# Patient Record
Sex: Female | Born: 1975 | Race: White | Hispanic: No | Marital: Single | Smoking: Former smoker
Health system: Southern US, Community
[De-identification: ages and names within clinical notes are randomized; demographics above are authoritative.]

## PROBLEM LIST (undated history)

## (undated) DIAGNOSIS — F329 Major depressive disorder, single episode, unspecified: Secondary | ICD-10-CM

## (undated) DIAGNOSIS — F32A Depression, unspecified: Secondary | ICD-10-CM

## (undated) DIAGNOSIS — F419 Anxiety disorder, unspecified: Secondary | ICD-10-CM

## (undated) DIAGNOSIS — I10 Essential (primary) hypertension: Secondary | ICD-10-CM

## (undated) DIAGNOSIS — IMO0001 Reserved for inherently not codable concepts without codable children: Secondary | ICD-10-CM

## (undated) DIAGNOSIS — G4730 Sleep apnea, unspecified: Secondary | ICD-10-CM

## (undated) DIAGNOSIS — J302 Other seasonal allergic rhinitis: Secondary | ICD-10-CM

## (undated) DIAGNOSIS — F17200 Nicotine dependence, unspecified, uncomplicated: Secondary | ICD-10-CM

## (undated) HISTORY — DX: Other seasonal allergic rhinitis: J30.2

## (undated) HISTORY — PX: RHINOPLASTY: SUR1284

---
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
## 1999-04-20 ENCOUNTER — Other Ambulatory Visit: Admission: RE | Admit: 1999-04-20 | Discharge: 1999-04-20 | Payer: Self-pay | Admitting: *Deleted

## 2000-03-29 ENCOUNTER — Ambulatory Visit (HOSPITAL_COMMUNITY): Admission: RE | Admit: 2000-03-29 | Discharge: 2000-03-29 | Payer: Self-pay | Admitting: Family Medicine

## 2000-03-29 ENCOUNTER — Encounter: Payer: Self-pay | Admitting: Family Medicine

## 2000-07-28 ENCOUNTER — Other Ambulatory Visit: Admission: RE | Admit: 2000-07-28 | Discharge: 2000-07-28 | Payer: Self-pay | Admitting: *Deleted

## 2000-11-28 ENCOUNTER — Emergency Department (HOSPITAL_COMMUNITY): Admission: EM | Admit: 2000-11-28 | Discharge: 2000-11-28 | Payer: Self-pay | Admitting: Emergency Medicine

## 2000-11-28 ENCOUNTER — Encounter: Payer: Self-pay | Admitting: Emergency Medicine

## 2001-02-22 ENCOUNTER — Other Ambulatory Visit: Admission: RE | Admit: 2001-02-22 | Discharge: 2001-02-22 | Payer: Self-pay | Admitting: Obstetrics & Gynecology

## 2001-10-01 ENCOUNTER — Inpatient Hospital Stay (HOSPITAL_COMMUNITY): Admission: AD | Admit: 2001-10-01 | Discharge: 2001-10-04 | Payer: Self-pay | Admitting: Obstetrics & Gynecology

## 2001-11-16 ENCOUNTER — Other Ambulatory Visit: Admission: RE | Admit: 2001-11-16 | Discharge: 2001-11-16 | Payer: Self-pay | Admitting: Obstetrics & Gynecology

## 2002-12-25 ENCOUNTER — Other Ambulatory Visit: Admission: RE | Admit: 2002-12-25 | Discharge: 2002-12-25 | Payer: Self-pay | Admitting: Obstetrics & Gynecology

## 2003-02-04 ENCOUNTER — Other Ambulatory Visit: Admission: RE | Admit: 2003-02-04 | Discharge: 2003-02-04 | Payer: Self-pay | Admitting: Obstetrics & Gynecology

## 2003-07-11 ENCOUNTER — Other Ambulatory Visit: Admission: RE | Admit: 2003-07-11 | Discharge: 2003-07-11 | Payer: Self-pay | Admitting: Obstetrics & Gynecology

## 2003-09-24 ENCOUNTER — Inpatient Hospital Stay (HOSPITAL_COMMUNITY): Admission: AD | Admit: 2003-09-24 | Discharge: 2003-09-24 | Payer: Self-pay | Admitting: Obstetrics & Gynecology

## 2003-09-30 ENCOUNTER — Inpatient Hospital Stay (HOSPITAL_COMMUNITY): Admission: AD | Admit: 2003-09-30 | Discharge: 2003-10-03 | Payer: Self-pay | Admitting: Obstetrics and Gynecology

## 2003-11-12 ENCOUNTER — Other Ambulatory Visit: Admission: RE | Admit: 2003-11-12 | Discharge: 2003-11-12 | Payer: Self-pay | Admitting: Obstetrics & Gynecology

## 2007-06-28 HISTORY — PX: KNEE ARTHROSCOPY WITH LATERAL RELEASE: SHX5649

## 2012-09-16 ENCOUNTER — Encounter (HOSPITAL_COMMUNITY)
Admission: EM | Disposition: A | Discharge status: Home / Self Care | Payer: Self-pay | Source: Home / Self Care | Attending: Emergency Medicine

## 2012-09-16 ENCOUNTER — Emergency Department (HOSPITAL_COMMUNITY): Payer: BC Managed Care – PPO | Admitting: Anesthesiology

## 2012-09-16 ENCOUNTER — Encounter (HOSPITAL_COMMUNITY): Payer: Self-pay | Admitting: Anesthesiology

## 2012-09-16 ENCOUNTER — Encounter (HOSPITAL_COMMUNITY): Payer: Self-pay | Admitting: *Deleted

## 2012-09-16 ENCOUNTER — Emergency Department (HOSPITAL_COMMUNITY): Payer: BC Managed Care – PPO

## 2012-09-16 ENCOUNTER — Observation Stay (HOSPITAL_COMMUNITY)
Admission: EM | Admit: 2012-09-16 | Discharge: 2012-09-18 | Disposition: A | Discharge status: Home / Self Care | Payer: BC Managed Care – PPO | Attending: General Surgery | Admitting: General Surgery

## 2012-09-16 DIAGNOSIS — R079 Chest pain, unspecified: Secondary | ICD-10-CM | POA: Insufficient documentation

## 2012-09-16 DIAGNOSIS — Z01810 Encounter for preprocedural cardiovascular examination: Secondary | ICD-10-CM | POA: Insufficient documentation

## 2012-09-16 DIAGNOSIS — K810 Acute cholecystitis: Secondary | ICD-10-CM

## 2012-09-16 DIAGNOSIS — K8010 Calculus of gallbladder with chronic cholecystitis without obstruction: Principal | ICD-10-CM | POA: Insufficient documentation

## 2012-09-16 DIAGNOSIS — K429 Umbilical hernia without obstruction or gangrene: Secondary | ICD-10-CM | POA: Insufficient documentation

## 2012-09-16 HISTORY — DX: Nicotine dependence, unspecified, uncomplicated: F17.200

## 2012-09-16 HISTORY — DX: Reserved for inherently not codable concepts without codable children: IMO0001

## 2012-09-16 HISTORY — PX: CHOLECYSTECTOMY: SHX55

## 2012-09-16 LAB — HEPATIC FUNCTION PANEL
AST: 25 U/L (ref 0–37)
Albumin: 4.2 g/dL (ref 3.5–5.2)
Total Bilirubin: 0.7 mg/dL (ref 0.3–1.2)
Total Protein: 7.6 g/dL (ref 6.0–8.3)

## 2012-09-16 LAB — POCT I-STAT TROPONIN I

## 2012-09-16 LAB — URINALYSIS, MICROSCOPIC ONLY
Glucose, UA: NEGATIVE mg/dL
Ketones, ur: NEGATIVE mg/dL
Leukocytes, UA: NEGATIVE
Nitrite: NEGATIVE
Protein, ur: NEGATIVE mg/dL
Urobilinogen, UA: 0.2 mg/dL (ref 0.0–1.0)

## 2012-09-16 LAB — BASIC METABOLIC PANEL
CO2: 25 mEq/L (ref 19–32)
Calcium: 10.1 mg/dL (ref 8.4–10.5)
Creatinine, Ser: 0.64 mg/dL (ref 0.50–1.10)
GFR calc Af Amer: >90 mL/min (ref >90)
GFR calc non Af Amer: >90 mL/min (ref >90)
Sodium: 138 mEq/L (ref 135–145)

## 2012-09-16 LAB — PREGNANCY, URINE: Preg Test, Ur: NEGATIVE

## 2012-09-16 LAB — CBC
MCH: 31.6 pg (ref 26.0–34.0)
Platelets: 457 10*3/uL — ABNORMAL HIGH (ref 150–400)
RBC: 4.71 MIL/uL (ref 3.87–5.11)
RDW: 12.7 % (ref 11.5–15.5)

## 2012-09-16 LAB — POCT PREGNANCY, URINE: Preg Test, Ur: NEGATIVE

## 2012-09-16 SURGERY — LAPAROSCOPIC CHOLECYSTECTOMY WITH INTRAOPERATIVE CHOLANGIOGRAM
Anesthesia: General | Wound class: Contaminated

## 2012-09-16 MED ORDER — SODIUM CHLORIDE 0.9 % IV SOLN: Freq: Once | INTRAVENOUS | Status: DC

## 2012-09-16 MED ORDER — LACTATED RINGERS IV SOLN
INTRAVENOUS | Status: DC | PRN
  Administered 2012-09-16: 1000 mL via INTRAVENOUS

## 2012-09-16 MED ORDER — ACETAMINOPHEN 10 MG/ML IV SOLN
INTRAVENOUS | Status: AC
  Filled 2012-09-16: qty 100

## 2012-09-16 MED ORDER — PIPERACILLIN-TAZOBACTAM 3.375 G IVPB 30 MIN
3.3750 g | Freq: Once | INTRAVENOUS | Status: AC
  Administered 2012-09-16: 3.375 g via INTRAVENOUS
  Filled 2012-09-16: qty 50

## 2012-09-16 MED ORDER — BUPIVACAINE-EPINEPHRINE 0.25% -1:200000 IJ SOLN
INTRAMUSCULAR | Status: AC
  Filled 2012-09-16: qty 1

## 2012-09-16 MED ORDER — NEOSTIGMINE METHYLSULFATE 1 MG/ML IJ SOLN
INTRAMUSCULAR | Status: DC | PRN
  Administered 2012-09-16: 4 mg via INTRAVENOUS

## 2012-09-16 MED ORDER — GLYCOPYRROLATE 0.2 MG/ML IJ SOLN
INTRAMUSCULAR | Status: DC | PRN
  Administered 2012-09-16: 0.6 mg via INTRAVENOUS

## 2012-09-16 MED ORDER — EPHEDRINE SULFATE 50 MG/ML IJ SOLN
INTRAMUSCULAR | Status: DC | PRN
  Administered 2012-09-16 (×2): 5 mg via INTRAVENOUS

## 2012-09-16 MED ORDER — PROMETHAZINE HCL 25 MG/ML IJ SOLN: 6.2500 mg | INTRAMUSCULAR | Status: DC | PRN

## 2012-09-16 MED ORDER — DEXTROSE IN LACTATED RINGERS 5 % IV SOLN
INTRAVENOUS | Status: DC
  Administered 2012-09-17 – 2012-09-18 (×3): via INTRAVENOUS

## 2012-09-16 MED ORDER — SUCCINYLCHOLINE CHLORIDE 20 MG/ML IJ SOLN
INTRAMUSCULAR | Status: DC | PRN
  Administered 2012-09-16: 100 mg via INTRAVENOUS

## 2012-09-16 MED ORDER — MORPHINE SULFATE 4 MG/ML IJ SOLN
4.0000 mg | Freq: Once | INTRAMUSCULAR | Status: AC
  Administered 2012-09-16: 4 mg via INTRAVENOUS
  Filled 2012-09-16: qty 1

## 2012-09-16 MED ORDER — HYDROMORPHONE HCL PF 1 MG/ML IJ SOLN
INTRAMUSCULAR | Status: AC
  Filled 2012-09-16: qty 1

## 2012-09-16 MED ORDER — BUPIVACAINE-EPINEPHRINE 0.25% -1:200000 IJ SOLN
INTRAMUSCULAR | Status: DC | PRN
  Administered 2012-09-16: 50 mL

## 2012-09-16 MED ORDER — ONDANSETRON HCL 4 MG/2ML IJ SOLN
4.0000 mg | Freq: Once | INTRAMUSCULAR | Status: AC
  Administered 2012-09-16: 4 mg via INTRAVENOUS
  Filled 2012-09-16: qty 2

## 2012-09-16 MED ORDER — DEXAMETHASONE SODIUM PHOSPHATE 10 MG/ML IJ SOLN
INTRAMUSCULAR | Status: DC | PRN
  Administered 2012-09-16: 10 mg via INTRAVENOUS

## 2012-09-16 MED ORDER — LIDOCAINE HCL (CARDIAC) 20 MG/ML IV SOLN
INTRAVENOUS | Status: DC | PRN
  Administered 2012-09-16: 50 mg via INTRAVENOUS

## 2012-09-16 MED ORDER — FENTANYL CITRATE 0.05 MG/ML IJ SOLN
INTRAMUSCULAR | Status: DC | PRN
  Administered 2012-09-16 (×2): 100 ug via INTRAVENOUS
  Administered 2012-09-16: 50 ug via INTRAVENOUS

## 2012-09-16 MED ORDER — ROCURONIUM BROMIDE 100 MG/10ML IV SOLN
INTRAVENOUS | Status: DC | PRN
  Administered 2012-09-16: 30 mg via INTRAVENOUS

## 2012-09-16 MED ORDER — ACETAMINOPHEN 10 MG/ML IV SOLN
INTRAVENOUS | Status: DC | PRN
  Administered 2012-09-16: 1000 mg via INTRAVENOUS

## 2012-09-16 MED ORDER — SODIUM CHLORIDE 0.9 % IV BOLUS (SEPSIS)
1000.0000 mL | Freq: Once | INTRAVENOUS | Status: AC
  Administered 2012-09-16: 1000 mL via INTRAVENOUS

## 2012-09-16 MED ORDER — IOHEXOL 300 MG/ML  SOLN
INTRAMUSCULAR | Status: AC
  Filled 2012-09-16: qty 1

## 2012-09-16 MED ORDER — ONDANSETRON HCL 4 MG/2ML IJ SOLN
INTRAMUSCULAR | Status: DC | PRN
  Administered 2012-09-16: 4 mg via INTRAVENOUS

## 2012-09-16 MED ORDER — LACTATED RINGERS IV SOLN
INTRAVENOUS | Status: DC | PRN
  Administered 2012-09-16 (×2): via INTRAVENOUS

## 2012-09-16 MED ORDER — IOHEXOL 300 MG/ML  SOLN
INTRAMUSCULAR | Status: DC | PRN
  Administered 2012-09-16: 50 mL

## 2012-09-16 MED ORDER — HYDROMORPHONE HCL PF 1 MG/ML IJ SOLN
0.2500 mg | INTRAMUSCULAR | Status: DC | PRN
  Administered 2012-09-16 (×4): 0.5 mg via INTRAVENOUS

## 2012-09-16 MED ORDER — PROPOFOL 10 MG/ML IV BOLUS
INTRAVENOUS | Status: DC | PRN
  Administered 2012-09-16: 150 mg via INTRAVENOUS

## 2012-09-16 SURGICAL SUPPLY — 42 items
APPLIER CLIP ROT 10 11.4 M/L (STAPLE) ×2
BENZOIN TINCTURE PRP APPL 2/3 (GAUZE/BANDAGES/DRESSINGS) IMPLANT
CANISTER SUCTION 2500CC (MISCELLANEOUS) ×2 IMPLANT
CATH REDDICK CHOLANGI 4FR 50CM (CATHETERS) IMPLANT
CHLORAPREP W/TINT 26ML (MISCELLANEOUS) ×2 IMPLANT
CLIP APPLIE ROT 10 11.4 M/L (STAPLE) ×1 IMPLANT
CLOTH BEACON ORANGE TIMEOUT ST (SAFETY) ×2 IMPLANT
COVER MAYO STAND STRL (DRAPES) ×2 IMPLANT
DECANTER SPIKE VIAL GLASS SM (MISCELLANEOUS) ×2 IMPLANT
DERMABOND ADVANCED (GAUZE/BANDAGES/DRESSINGS) ×1
DERMABOND ADVANCED .7 DNX12 (GAUZE/BANDAGES/DRESSINGS) ×1 IMPLANT
DRAPE C-ARM 42X72 X-RAY (DRAPES) ×2 IMPLANT
DRAPE LAPAROSCOPIC ABDOMINAL (DRAPES) ×2 IMPLANT
ELECT REM PT RETURN 9FT ADLT (ELECTROSURGICAL) ×2
ELECTRODE REM PT RTRN 9FT ADLT (ELECTROSURGICAL) ×1 IMPLANT
GLOVE BIOGEL PI IND STRL 7.0 (GLOVE) ×1 IMPLANT
GLOVE BIOGEL PI INDICATOR 7.0 (GLOVE) ×1
GLOVE SS BIOGEL STRL SZ 7.5 (GLOVE) ×1 IMPLANT
GLOVE SUPERSENSE BIOGEL SZ 7.5 (GLOVE) ×1
GOWN STRL NON-REIN LRG LVL3 (GOWN DISPOSABLE) ×2 IMPLANT
GOWN STRL REIN XL XLG (GOWN DISPOSABLE) ×4 IMPLANT
HEMOSTAT SURGICEL 4X8 (HEMOSTASIS) IMPLANT
IV CATH 14GX2 1/4 (CATHETERS) IMPLANT
IV SET EXT 30 76VOL 4 MALE LL (IV SETS) IMPLANT
KIT BASIN OR (CUSTOM PROCEDURE TRAY) ×2 IMPLANT
NS IRRIG 1000ML POUR BTL (IV SOLUTION) IMPLANT
POUCH SPECIMEN RETRIEVAL 10MM (ENDOMECHANICALS) IMPLANT
SET CHOLANGIOGRAPH MIX (MISCELLANEOUS) ×2 IMPLANT
SET IRRIG TUBING LAPAROSCOPIC (IRRIGATION / IRRIGATOR) ×2 IMPLANT
SOLUTION ANTI FOG 6CC (MISCELLANEOUS) ×2 IMPLANT
STOPCOCK K 69 2C6206 (IV SETS) IMPLANT
STRIP CLOSURE SKIN 1/2X4 (GAUZE/BANDAGES/DRESSINGS) IMPLANT
SUT MNCRL AB 4-0 PS2 18 (SUTURE) ×2 IMPLANT
SUT VICRYL 0 UR6 27IN ABS (SUTURE) ×2 IMPLANT
TOWEL OR 17X26 10 PK STRL BLUE (TOWEL DISPOSABLE) ×2 IMPLANT
TRAY LAP CHOLE (CUSTOM PROCEDURE TRAY) ×2 IMPLANT
TROCAR SLEEVE XCEL 5X75 (ENDOMECHANICALS) ×2 IMPLANT
TROCAR XCEL BLADELESS 5X75MML (TROCAR) ×2 IMPLANT
TROCAR XCEL BLUNT TIP 100MML (ENDOMECHANICALS) ×2 IMPLANT
TROCAR XCEL NON-BLD 11X100MML (ENDOMECHANICALS) ×2 IMPLANT
TROCAR Z-THREAD FIOS 5X100MM (TROCAR) ×2 IMPLANT
TUBING INSUFFLATION 10FT LAP (TUBING) ×2 IMPLANT

## 2012-09-16 NOTE — Op Note (Signed)
Preoperative diagnosis: Cholelithiasis and cholecystitis  Postoperative diagnosis: Cholelithiasis and cholecystitis  Surgical procedure: Laparoscopic cholecystectomy   Surgeon: Sharlet Salina Lyons. Annalea Alguire M.D.  Assistant: Harriette Bouillon  Anesthesia: General Endotracheal  Complications: None  Estimated blood loss: Minimal  Description of procedure: The patient brought to the operating room, placed in the supine position on the operating table, and general endotracheal anesthesia induced. The abdomen was widely sterilely prepped and draped. The patient had received preoperative IV antibiotics and PAS were in place. Patient timeout was performed the correct procedure verified. Standard 4 port technique was used with an open Hassan cannula at the umbilicus and the remainder of the ports placed under direct vision. The gallbladder was visualized. It appeared markedly edematous. The fundus was grasped and elevated up over the liver and the infundibulum retracted inferiolaterally. Peritoneum anterior and posterior to close triangle was incised and fibrofatty tissue stripped off the neck of the gallbladder toward the porta hepatis. The distal gallbladder was thoroughly dissected. The cystic artery was identified in close triangle and the cystic duct gallbladder junction dissected 360.  A good critical view was obtained. When the anatomy was clear the cystic duct was clipped at the gallbladder junction and an operative cholangiogram attempted but the cystic duct was so tiny it would not admit the cholangiocath. The cystic duct was doubly clipped proximally and divided. The cystic artery was doubly clipped proximally and distally and divided. The gallbladder was dissected free from its bed using hook cautery and removed through the umbilical port site. Complete hemostasis was obtained in the gallbladder bed. The right upper quadrant was thoroughly irrigated and hemostasis assured. Trochars were removed and all CO2  evacuated and the [facility] trocar site fascial defect closed. A very small umbilical hernia was defined and included in the closure. Skin incisions were closed with subcuticular Monocryl and Dermabond. Sponge needle and instrument counts were correct. The patient was taken to PACU in good condition.  Savannah Lyons  09/16/2012

## 2012-09-16 NOTE — Anesthesia Preprocedure Evaluation (Addendum)
Anesthesia Evaluation  Patient identified by MRN, date of birth, ID band Patient awake    Reviewed: Allergy & Precautions, H&P , NPO status , Patient's Chart, lab work & pertinent test results, reviewed documented beta blocker date and time   Airway Mallampati: II TM Distance: >3 FB Neck ROM: full    Dental no notable dental hx.    Pulmonary neg pulmonary ROS,  breath sounds clear to auscultation  Pulmonary exam normal       Cardiovascular Exercise Tolerance: Good negative cardio ROS  Rhythm:regular Rate:Normal     Neuro/Psych negative neurological ROS  negative psych ROS   GI/Hepatic negative GI ROS, Neg liver ROS,   Endo/Other  negative endocrine ROS  Renal/GU negative Renal ROS   Negative pregnancy test.    Musculoskeletal   Abdominal   Peds  Hematology negative hematology ROS (+)   Anesthesia Other Findings   Reproductive/Obstetrics negative OB ROS                           Anesthesia Physical Anesthesia Plan  ASA: II  Anesthesia Plan: General ETT   Post-op Pain Management:    Induction:   Airway Management Planned:   Additional Equipment:   Intra-op Plan:   Post-operative Plan:   Informed Consent: I have reviewed the patients History and Physical, chart, labs and discussed the procedure including the risks, benefits and alternatives for the proposed anesthesia with the patient or authorized representative who has indicated his/her understanding and acceptance.   Dental Advisory Given  Plan Discussed with: CRNA  Anesthesia Plan Comments:         Anesthesia Quick Evaluation

## 2012-09-16 NOTE — ED Provider Notes (Signed)
History     CSN: [id number]  Arrival date & time 09/16/12  1443   First MD Initiated Contact with Patient 09/16/12 1554      Chief Complaint  Patient presents with  . Chest Pain  . Abdominal Pain  . sent from urgent care     (Consider location/radiation/quality/duration/timing/severity/associated sxs/prior treatment) The history is provided by the patient.  Savannah Lyons is a 37 y.o. female history of reflux here presenting with chest pain and right upper quadrant pain. Right-sided chest and was a cramping constant pain. She then had right upper quadrant pain and some nausea. Denies any vomiting or fevers. She said this does not feel the same as her reflux symptoms. She went to urgent care and had a white blood cell that was elevated and had normal KUB and was sent here for rule out cholecystitis. No history of gallstones in the past. No abdominal surgeries.    History reviewed. No pertinent past medical history.  No past surgical history on file.  No family history on file.  History  Substance Use Topics  . Smoking status: Not on file  . Smokeless tobacco: Not on file  . Alcohol Use: Not on file    OB History   Grav Para Term Preterm Abortions TAB SAB Ect Mult Living                  Review of Systems  Cardiovascular: Positive for chest pain.  Gastrointestinal: Positive for abdominal pain.    Allergies  Codeine  Home Medications   Current Outpatient Rx  Name  Route  Sig  Dispense  Refill  . citalopram (CELEXA) 40 MG tablet   Oral   Take 40 mg by mouth daily.         Marland Kitchen ibuprofen (ADVIL,MOTRIN) 200 MG tablet   Oral   Take 400 mg by mouth every 6 (six) hours as needed for pain.           BP 142/93  Pulse 89  Temp(Src) 99.8 F (37.7 C) (Oral)  Resp 16  SpO2 97%  LMP 09/05/2012  Physical Exam  Nursing note and vitals reviewed. Constitutional: She is oriented to person, place, and time. She appears well-developed and well-nourished.  Slightly  uncomfortable   HENT:  Head: Normocephalic.  Mouth/Throat: Oropharynx is clear and moist.  Eyes: Conjunctivae are normal. Pupils are equal, round, and reactive to light.  Neck: Normal range of motion. Neck supple.  Cardiovascular: Normal rate, regular rhythm and normal heart sounds.   Pulmonary/Chest: Effort normal and breath sounds normal. No respiratory distress. She has no wheezes. She has no rales.  Mild reproducible tenderness on R ribs   Abdominal: Soft.  + RUQ tenderness, no rebound. Mild murphy's sign.   Musculoskeletal: Normal range of motion. She exhibits no edema.  Neurological: She is alert and oriented to person, place, and time.  Skin: Skin is warm and dry.  Psychiatric: She has a normal mood and affect. Her behavior is normal. Judgment and thought content normal.    ED Course  Procedures (including critical care time)  Labs Reviewed  CBC - Abnormal; Notable for the following:    WBC 21.2 (*)    Platelets 457 (*)    All other components within normal limits  BASIC METABOLIC PANEL - Abnormal; Notable for the following:    Glucose, Bld 109 (*)    All other components within normal limits  URINALYSIS, MICROSCOPIC ONLY - Abnormal; Notable for the following:  APPearance CLOUDY (*)    Hgb urine dipstick MODERATE (*)    All other components within normal limits  LIPASE, BLOOD  PREGNANCY, URINE  POCT I-STAT TROPONIN I  POCT PREGNANCY, URINE   US Abdomen Complete  09/16/2012  *RADIOLOGY REPORT*  Clinical Data:  Right upper quadrant abdominal pain.  COMPLETE ABDOMINAL ULTRASOUND  Comparison:  None.  Findings:  Gallbladder:  There are shadowing gallstones in the gallbladder with an 18 mm gallstone in the gallbladder neck.  Moderate wall thickening is present along with edema in the wall suggesting acute cholecystitis.  Negative sonographic Murphy's sign.  Common bile duct:  Normal in caliber measuring a maximum of 4.26mm.  Liver:  The liver is sonographically unremarkable.   There is normal echogenicity without focal lesions or intrahepatic biliary dilatation.  IVC:  Normal caliber.  Pancreas:  Sonographically normal.  Spleen:  Normal size and echogenicity without focal lesions.  Right Kidney:  11.8 cm in length. Normal renal cortical thickness and echogenicity without focal lesions or hydronephrosis.  Left Kidney:  11.9 cm in length. Normal renal cortical thickness and echogenicity without focal lesions or hydronephrosis.  Abdominal aorta:  Normal caliber.  IMPRESSION:  1.  Cholelithiasis and gallbladder wall thickening and edema suggesting acute cholecystitis. 2.  Normal caliber common bile duct.   Original Report Authenticated By: Rudie Meyer, M.D.    Dg Chest Port 1 View  09/16/2012  *RADIOLOGY REPORT*  Clinical Data: Upper abdominal pain, chest pain  PORTABLE CHEST - 1 VIEW  Comparison: None.  Findings: Cardiomediastinal silhouette is unremarkable.  No acute infiltrate or pleural effusion.  No pulmonary edema.  IMPRESSION: No active disease.   Original Report Authenticated By: Natasha Mead, M.D.      No diagnosis found.    MDM  Savannah Lyons is a 37 y.o. female here presenting with right upper quadrant pain and chest pain. Chest pain very atypical for ACS and was reproducible and i-STAT troponin I x 1 is negative. Ab pain likely from gallstones. WBC 21 here. Will get RUQ [place] and give pain meds and reassess.   7:30 PM Patient's US showed acute chole. Zosyn given, I called surgeon, Dr. Johna Sheriff, who will evaluate patient.        Richardean Canal, MD 09/16/12 2036

## 2012-09-16 NOTE — Preoperative (Signed)
Beta Blockers   Reason not to administer Beta Blockers:Not Applicable 

## 2012-09-16 NOTE — ED Notes (Addendum)
Upper abdominal pain since last night, 7/10. And some chest pain, radiates to right 7/10. Denies SOB. Sent from urgent care, xrays performed, suspected gall bladder issues. Nausea present, denies vomiting

## 2012-09-16 NOTE — H&P (Signed)
Savannah Lyons is an 37 y.o. female.   Chief Complaint: Abdominal pain  HPI: patient is a generally healthy 37 year old female who 24 hours ago had the rapid onset of epigastric abdominal pain. The pain has been constant and moderately severe ever since. She describes a pressure-like pain in her epigastrium radiating around to her back. This was associated with nausea but no vomiting. The pain persisted into today and she presented to the emergency room. She has had some mild brief episodes of epigastric pain for some months. She has felt feverish but no chills. No jaundice. No urinary symptoms. No melena or hematemesis. She has no chronic GI symptoms.  History reviewed. No pertinent past medical history.  No past surgical history on file.  No family history on file. Social History:  has no tobacco, alcohol, and drug history on file.  Allergies:  Allergies  Allergen Reactions  . Codeine Nausea Only    Current Facility-Administered Medications  Medication Dose Route Frequency Provider Last Rate Last Dose  . 0.9 %  sodium chloride infusion   Intravenous Once Richardean Canal, MD      . piperacillin-tazobactam (ZOSYN) IVPB 3.375 g  3.375 g Intravenous Once Richardean Canal, MD       Current Outpatient Prescriptions  Medication Sig Dispense Refill  . citalopram (CELEXA) 40 MG tablet Take 40 mg by mouth daily.      Marland Kitchen ibuprofen (ADVIL,MOTRIN) 200 MG tablet Take 400 mg by mouth every 6 (six) hours as needed for pain.         Results for orders placed during the hospital encounter of 09/16/12 (from the past 48 hour(s))  CBC     Status: Abnormal   Collection Time    09/16/12  3:20 PM      Result Value Range   WBC 21.2 (*) 4.0 - 10.5 K/uL   RBC 4.71  3.87 - 5.11 MIL/uL   Hemoglobin 14.9  12.0 - 15.0 g/dL   HCT 40.9  81.1 - 91.4 %   MCV 89.0  78.0 - 100.0 fL   MCH 31.6  26.0 - 34.0 pg   MCHC 35.6  30.0 - 36.0 g/dL   RDW 78.2  95.6 - 21.3 %   Platelets 457 (*) 150 - 400 K/uL  BASIC METABOLIC  PANEL     Status: Abnormal   Collection Time    09/16/12  3:20 PM      Result Value Range   Sodium 138  135 - 145 mEq/L   Potassium 3.7  3.5 - 5.1 mEq/L   Comment: SLIGHT HEMOLYSIS   Chloride 101  96 - 112 mEq/L   CO2 25  19 - 32 mEq/L   Glucose, Bld 109 (*) 70 - 99 mg/dL   BUN 9  6 - 23 mg/dL   Creatinine, Ser 0.86  0.50 - 1.10 mg/dL   Calcium 57.8  8.4 - 46.9 mg/dL   GFR calc non Af Amer >90  >90 mL/min   GFR calc Af Amer >90  >90 mL/min   Comment:            The eGFR has been calculated     using the CKD EPI equation.     This calculation has not been     validated in all clinical     situations.     eGFR's persistently     <90 mL/min signify     possible Chronic Kidney Disease.  LIPASE, BLOOD     Status:  None   Collection Time    09/16/12  3:20 PM      Result Value Range   Lipase 25  11 - 59 U/L  URINALYSIS, MICROSCOPIC ONLY     Status: Abnormal   Collection Time    09/16/12  3:24 PM      Result Value Range   Color, Urine YELLOW  YELLOW   APPearance CLOUDY (*) CLEAR   Specific Gravity, Urine 1.023  1.005 - 1.030   pH 6.5  5.0 - 8.0   Glucose, UA NEGATIVE  NEGATIVE mg/dL   Hgb urine dipstick MODERATE (*) NEGATIVE   Bilirubin Urine NEGATIVE  NEGATIVE   Ketones, ur NEGATIVE  NEGATIVE mg/dL   Protein, ur NEGATIVE  NEGATIVE mg/dL   Urobilinogen, UA 0.2  0.0 - 1.0 mg/dL   Nitrite NEGATIVE  NEGATIVE   Leukocytes, UA NEGATIVE  NEGATIVE   WBC, UA 0-2  <3 WBC/hpf   RBC / HPF 3-6  <3 RBC/hpf   Squamous Epithelial / LPF RARE  RARE   Urine-Other MUCOUS PRESENT    POCT PREGNANCY, URINE     Status: None   Collection Time    09/16/12  3:24 PM      Result Value Range   Preg Test, Ur NEGATIVE  NEGATIVE   Comment:            THE SENSITIVITY OF THIS     METHODOLOGY IS >24 mIU/mL  POCT I-STAT TROPONIN I     Status: None   Collection Time    09/16/12  3:28 PM      Result Value Range   Troponin i, poc 0.00  0.00 - 0.08 ng/mL   Comment 3            Comment: Due to the  release kinetics of cTnI,     a negative result within the first hours     of the onset of symptoms does not rule out     myocardial infarction with certainty.     If myocardial infarction is still suspected,     repeat the test at appropriate intervals.  PREGNANCY, URINE     Status: None   Collection Time    09/16/12  3:58 PM      Result Value Range   Preg Test, Ur NEGATIVE  NEGATIVE   Comment:            THE SENSITIVITY OF THIS     METHODOLOGY IS >20 mIU/mL.   US Abdomen Complete  09/16/2012  *RADIOLOGY REPORT*  Clinical Data:  Right upper quadrant abdominal pain.  COMPLETE ABDOMINAL ULTRASOUND  Comparison:  None.  Findings:  Gallbladder:  There are shadowing gallstones in the gallbladder with an 18 mm gallstone in the gallbladder neck.  Moderate wall thickening is present along with edema in the wall suggesting acute cholecystitis.  Negative sonographic Murphy's sign.  Common bile duct:  Normal in caliber measuring a maximum of 4.20mm.  Liver:  The liver is sonographically unremarkable.  There is normal echogenicity without focal lesions or intrahepatic biliary dilatation.  IVC:  Normal caliber.  Pancreas:  Sonographically normal.  Spleen:  Normal size and echogenicity without focal lesions.  Right Kidney:  11.8 cm in length. Normal renal cortical thickness and echogenicity without focal lesions or hydronephrosis.  Left Kidney:  11.9 cm in length. Normal renal cortical thickness and echogenicity without focal lesions or hydronephrosis.  Abdominal aorta:  Normal caliber.  IMPRESSION:  1.  Cholelithiasis and gallbladder wall  thickening and edema suggesting acute cholecystitis. 2.  Normal caliber common bile duct.   Original Report Authenticated By: Rudie Meyer, M.D.    Dg Chest Port 1 View  09/16/2012  *RADIOLOGY REPORT*  Clinical Data: Upper abdominal pain, chest pain  PORTABLE CHEST - 1 VIEW  Comparison: None.  Findings: Cardiomediastinal silhouette is unremarkable.  No acute infiltrate or  pleural effusion.  No pulmonary edema.  IMPRESSION: No active disease.   Original Report Authenticated By: Natasha Mead, M.D.     Review of Systems  Constitutional: Positive for fever. Negative for chills.  Eyes: Negative.   Respiratory: Negative.   Cardiovascular: Negative.   Gastrointestinal: Positive for nausea and abdominal pain. Negative for heartburn, vomiting, diarrhea, constipation, blood in stool and melena.  Genitourinary: Negative.     Blood pressure 142/93, pulse 89, temperature 99.8 F (37.7 C), temperature source Oral, resp. rate 16, last menstrual period 09/05/2012, SpO2 97.00%. Physical Exam  General: Mildly overweight Caucasian female in no distress Skin: Warm and dry without rash or infection HEENT: No palpable mass or thyromegaly. Sclera nonicteric. Oropharynx clear Lungs: Clear equal breath sounds without increased work of breathing Cardiac: Regular rate and rhythm. No murmurs. No edema. Abdomen: Nondistended. Hypoactive bowel sounds. There is localized epigastric and right upper quadrant tenderness with guarding. No palpable masses or hepatosplenomegaly. Extremities: No edema or joint swelling or deformity Neurologic: She is alert and fully oriented. Affect normal. No gross motor deficits.  Assessment/Plan Acute epigastric abdominal pain and tenderness with fever entirely consistent with acute cholecystitis. Ultrasound has confirmed a stone impacted in the neck of the gallbladder with thickened gallbladder wall and edema. She has elevated white count of 21,000. I've recommended proceeding with urgent laparoscopic cholecystectomy with cholangiogram. I discussed the procedure in detail.   We discussed the risks and benefits of a laparoscopic cholecystectomy and possible cholangiogram including, but not limited to bleeding, infection, injury to surrounding structures such as the intestine or liver, bile leak, retained gallstones, need to convert to an open procedure, prolonged  diarrhea, blood clots such as  DVT, common bile duct injury, anesthesia risks, and possible need for additional procedures.  The likelihood of improvement in symptoms and return to the patient's normal status is good. We discussed the typical post-operative recovery course.  Drexler Maland T 09/16/2012, 8:23 PM

## 2012-09-16 NOTE — ED Notes (Signed)
US at bedside

## 2012-09-16 NOTE — Anesthesia Postprocedure Evaluation (Signed)
  Anesthesia Post-op Note  Patient: Savannah Lyons  Procedure(s) Performed: Procedure(s) (LRB): LAPAROSCOPIC CHOLECYSTECTOMY  (N/A)  Patient Location: PACU  Anesthesia Type: General  Level of Consciousness: awake and alert   Airway and Oxygen Therapy: Patient Spontanous Breathing  Post-op Pain: mild  Post-op Assessment: Post-op Vital signs reviewed, Patient's Cardiovascular Status Stable, Respiratory Function Stable, Patent Airway and No signs of Nausea or vomiting  Last Vitals:  Filed Vitals:   09/16/12 1630  BP: 142/93  Pulse: 89  Temp:   Resp: 16    Post-op Vital Signs: stable   Complications: No apparent anesthesia complications

## 2012-09-16 NOTE — Transfer of Care (Signed)
Immediate Anesthesia Transfer of Care Note  Patient: Savannah Lyons  Procedure(s) Performed: Procedure(s): LAPAROSCOPIC CHOLECYSTECTOMY  (N/A)  Patient Location: PACU  Anesthesia Type:General  Level of Consciousness: awake, alert , oriented and patient cooperative  Airway & Oxygen Therapy: Patient Spontanous Breathing and Patient connected to face mask oxygen  Post-op Assessment: Report given to PACU RN, Post -op Vital signs reviewed and stable and Patient moving all extremities X 4  Post vital signs: Reviewed and stable  Complications: No apparent anesthesia complications

## 2012-09-16 NOTE — ED Notes (Signed)
Pt alert and oriented x4. Respirations even and unlabored, bilateral symmetrical rise and fall of chest. Skin warm and dry. In no acute distress. Denies needs.   

## 2012-09-17 ENCOUNTER — Encounter (HOSPITAL_COMMUNITY): Payer: Self-pay

## 2012-09-17 LAB — CBC
HCT: 36.3 % (ref 36.0–46.0)
Hemoglobin: 12.5 g/dL (ref 12.0–15.0)
MCH: 31.1 pg (ref 26.0–34.0)
MCHC: 34.4 g/dL (ref 30.0–36.0)
MCV: 90.3 fL (ref 78.0–100.0)
RBC: 4.02 MIL/uL (ref 3.87–5.11)

## 2012-09-17 MED ORDER — OXYCODONE-ACETAMINOPHEN 5-325 MG PO TABS
1.0000 | ORAL_TABLET | ORAL | Status: DC | PRN
  Administered 2012-09-17 (×2): 2 via ORAL
  Filled 2012-09-17 (×2): qty 2

## 2012-09-17 MED ORDER — OXYCODONE-ACETAMINOPHEN 5-325 MG PO TABS: 1.0000 | ORAL_TABLET | ORAL | Status: DC | PRN

## 2012-09-17 MED ORDER — HEPARIN SODIUM (PORCINE) 5000 UNIT/ML IJ SOLN
5000.0000 [IU] | Freq: Three times a day (TID) | INTRAMUSCULAR | Status: DC
  Administered 2012-09-17 – 2012-09-18 (×5): 5000 [IU] via SUBCUTANEOUS
  Filled 2012-09-17 (×8): qty 1

## 2012-09-17 MED ORDER — KETOROLAC TROMETHAMINE 30 MG/ML IJ SOLN
30.0000 mg | Freq: Four times a day (QID) | INTRAMUSCULAR | Status: DC
  Administered 2012-09-17 – 2012-09-18 (×4): 30 mg via INTRAVENOUS
  Filled 2012-09-17 (×8): qty 1

## 2012-09-17 MED ORDER — ONDANSETRON HCL 4 MG/2ML IJ SOLN: 4.0000 mg | Freq: Four times a day (QID) | INTRAMUSCULAR | Status: DC | PRN

## 2012-09-17 MED ORDER — MORPHINE SULFATE 2 MG/ML IJ SOLN
2.0000 mg | INTRAMUSCULAR | Status: DC | PRN
  Administered 2012-09-17 (×3): 2 mg via INTRAVENOUS
  Filled 2012-09-17 (×3): qty 1

## 2012-09-17 MED ORDER — ONDANSETRON HCL 4 MG PO TABS: 4.0000 mg | ORAL_TABLET | Freq: Four times a day (QID) | ORAL | Status: DC | PRN

## 2012-09-17 MED ORDER — CITALOPRAM HYDROBROMIDE 40 MG PO TABS
40.0000 mg | ORAL_TABLET | Freq: Every day | ORAL | Status: DC
  Administered 2012-09-17 – 2012-09-18 (×2): 40 mg via ORAL
  Filled 2012-09-17 (×2): qty 1

## 2012-09-17 NOTE — Progress Notes (Signed)
Still requiring some IV pain medication. No nausea Wants to try solid food.  Possible discharge later today or tomorrow morning.  Wilmon Arms. Corliss Skains, MD, [facility]  09/17/2012 11:14 AM

## 2012-09-17 NOTE — Care Management Note (Signed)
    Page 1 of 1   09/17/2012     11:45:53 AM   CARE MANAGEMENT NOTE 09/17/2012  Patient:  Savannah Lyons,Savannah Lyons   Account Number:  [id number]  Date Initiated:  09/17/2012  Documentation initiated by:  Lorenda Ishihara  Subjective/Objective Assessment:   37 yo female admitted s/p lap chole. PTA lived at home with family.     Action/Plan:   Home when stable   Anticipated DC Date:  09/17/2012   Anticipated DC Plan:  HOME/SELF CARE      DC Planning Services  CM consult      Choice offered to / List presented to:             Status of service:  Completed, signed off Medicare Important Message given?   (If response is "NO", the following Medicare IM given date fields will be blank) Date Medicare IM given:   Date Additional Medicare IM given:    Discharge Disposition:  HOME/SELF CARE  Per UR Regulation:  Reviewed for med. necessity/level of care/duration of stay  If discussed at Long Length of Stay Meetings, dates discussed:    Comments:

## 2012-09-17 NOTE — Discharge Summary (Signed)
  Physician Discharge Summary  Patient ID: Savannah Lyons MRN: [id number] DOB/AGE: Sep 21, 1975 37 y.o.  Admit date: 09/16/2012 Discharge date: 09/18/2012  Admitting Diagnosis: Cholelithiasis and cholecystitis  Discharge Diagnosis Cholelithiasis and cholecystitis  Consultants None  Procedures Laparoscopic Cholecystectomy with [facility]  Hospital Course 37 yr old female who presented to [facility] with abdominal pain.  Workup showed Cholelithiasis and cholecystitis.  Patient was admitted and underwent procedure listed above.  Tolerated procedure well and was transferred to the floor.  Diet was advanced as tolerated.  On POD#2, the patient was voiding well, tolerating diet, ambulating well, pain well controlled, vital signs stable, incisions c/d/i and felt stable for discharge home.  Patient will follow up in our office in 2 weeks and knows to call with questions or concerns.  Physical exam: General: awake, alert, NAD Abd: soft, mildly tender over incisions, +bs Ext: warm, no edema VSS afebrile     Medication List    TAKE these medications       citalopram 40 MG tablet  Commonly known as:  CELEXA  Take 40 mg by mouth daily.     ibuprofen 200 MG tablet  Commonly known as:  ADVIL,MOTRIN  Take 400 mg by mouth every 6 (six) hours as needed for pain.     ketorolac 10 MG tablet  Commonly known as:  TORADOL  Take 1 tablet (10 mg total) by mouth every 8 (eight) hours as needed for pain.     oxyCODONE 5 MG immediate release tablet  Commonly known as:  Oxy IR/ROXICODONE  Take 1-2 tablets (5-10 mg total) by mouth every 4 (four) hours as needed.             Follow-up Information   Follow up with [facility]. Schedule an appointment as soon as possible for a visit in 2 weeks. (Our office will contact you with your appt day and time)    Contact information:   [address]   [place] [address] [phone]       Signed: Denny Levy [facility] [phone]  09/17/2012, 8:31 AM

## 2012-09-17 NOTE — Progress Notes (Signed)
Patient ID: Savannah Lyons, female   DOB: [birth year], 37 y.o.   MRN: [id number] 1 Day Post-Op  Subjective: Pretty sore this am but otherwise doing ok, denies n/v but hasn't had much to eat yet.  Objective: Vital signs in last 24 hours: Temp:  [97.7 F (36.5 C)-99.8 F (37.7 C)] 98.4 F (36.9 C) (03/24 0556) Pulse Rate:  [67-100] 75 (03/24 0556) Resp:  [11-18] 18 (03/24 0556) BP: (107-162)/(53-93) 107/56 mmHg (03/24 0556) SpO2:  [94 %-100 %] 99 % (03/24 0556) Last BM Date: 09/16/12  Intake/Output from previous day: 03/23 0701 - 03/24 0700 In: 1706.3 [P.O.:240; I.V.:1466.3] Out: 607 [Urine:601; Stool:1; Blood:5] Intake/Output this shift:    PE: Abd: soft, tender over incisions, +bs, incisions are c/d/i General: awake, alert, NAD  Lab Results:   Recent Labs  09/16/12 1520 09/17/12 0433  WBC 21.2* 14.8*  HGB 14.9 12.5  HCT 41.9 36.3  PLT 457* 364   BMET  Recent Labs  09/16/12 1520  NA 138  K 3.7  CL 101  CO2 25  GLUCOSE 109*  BUN 9  CREATININE 0.64  CALCIUM 10.1   PT/INR No results found for this basename: LABPROT, INR,  in the last 72 hours CMP     Component Value Date/Time   NA 138 09/16/2012 1520   K 3.7 09/16/2012 1520   CL 101 09/16/2012 1520   CO2 25 09/16/2012 1520   GLUCOSE 109* 09/16/2012 1520   BUN 9 09/16/2012 1520   CREATININE 0.64 09/16/2012 1520   CALCIUM 10.1 09/16/2012 1520   PROT 7.6 09/16/2012 1520   ALBUMIN 4.2 09/16/2012 1520   AST 25 09/16/2012 1520   ALT 13 09/16/2012 1520   ALKPHOS 82 09/16/2012 1520   BILITOT 0.7 09/16/2012 1520   GFRNONAA >90 09/16/2012 1520   GFRAA >90 09/16/2012 1520   Lipase     Component Value Date/Time   LIPASE 25 09/16/2012 1520       Studies/Results: US Abdomen Complete  09/16/2012  *RADIOLOGY REPORT*  Clinical Data:  Right upper quadrant abdominal pain.  COMPLETE ABDOMINAL ULTRASOUND  Comparison:  None.  Findings:  Gallbladder:  There are shadowing gallstones in the gallbladder with an 18 mm gallstone  in the gallbladder neck.  Moderate wall thickening is present along with edema in the wall suggesting acute cholecystitis.  Negative sonographic Murphy's sign.  Common bile duct:  Normal in caliber measuring a maximum of 4.42mm.  Liver:  The liver is sonographically unremarkable.  There is normal echogenicity without focal lesions or intrahepatic biliary dilatation.  IVC:  Normal caliber.  Pancreas:  Sonographically normal.  Spleen:  Normal size and echogenicity without focal lesions.  Right Kidney:  11.8 cm in length. Normal renal cortical thickness and echogenicity without focal lesions or hydronephrosis.  Left Kidney:  11.9 cm in length. Normal renal cortical thickness and echogenicity without focal lesions or hydronephrosis.  Abdominal aorta:  Normal caliber.  IMPRESSION:  1.  Cholelithiasis and gallbladder wall thickening and edema suggesting acute cholecystitis. 2.  Normal caliber common bile duct.   Original Report Authenticated By: Rudie Meyer, M.D.    Dg Chest Port 1 View  09/16/2012  *RADIOLOGY REPORT*  Clinical Data: Upper abdominal pain, chest pain  PORTABLE CHEST - 1 VIEW  Comparison: None.  Findings: Cardiomediastinal silhouette is unremarkable.  No acute infiltrate or pleural effusion.  No pulmonary edema.  IMPRESSION: No active disease.   Original Report Authenticated By: Natasha Mead, M.D.     Anti-infectives: Anti-infectives  Start     Dose/Rate Route Frequency Ordered Stop   09/16/12 1930  [MAR Hold]  piperacillin-tazobactam (ZOSYN) IVPB 3.375 g     (On MAR Hold since 09/16/12 2105)   3.375 g 100 mL/hr over 30 Minutes Intravenous  Once 09/16/12 1914 09/16/12 2117       Assessment/Plan 1. POD#1-lap chole: overall doing ok, needs to eat this am, try clears first then regular at lunch if no problems this am, if pain control with PO meds and tolerating regular diet then she could go home after lunch today, otherwise will need to stay another night.   LOS: 1 day    Savannah Lyons,  Savannah Lyons 09/17/2012

## 2012-09-17 NOTE — Discharge Instructions (Signed)
[facility] ______[facility] °LAPAROSCOPIC SURGERY: POST OP INSTRUCTIONS °Always review your discharge instruction sheet given to you by the facility where your surgery was performed. °IF YOU HAVE DISABILITY OR FAMILY LEAVE FORMS, YOU MUST BRING THEM TO THE OFFICE FOR PROCESSING.   °DO NOT GIVE THEM TO YOUR DOCTOR. ° °1. A prescription for pain medication may be given to you upon discharge.  Take your pain medication as prescribed, if needed.  If narcotic pain medicine is not needed, then you may take acetaminophen (Tylenol) or ibuprofen (Advil) as needed. °2. Take your usually prescribed medications unless otherwise directed. °3. If you need a refill on your pain medication, please contact your pharmacy.  They will contact our office to request authorization. Prescriptions will not be filled after 5pm or on week-ends. °4. You should follow a light diet the first few days after arrival home, such as soup and crackers, etc.  Be sure to include lots of fluids daily. °5. Most patients will experience some swelling and bruising in the area of the incisions.  Ice packs will help.  Swelling and bruising can take several days to resolve.  °6. It is common to experience some constipation if taking pain medication after surgery.  Increasing fluid intake and taking a stool softener (such as Colace) will usually help or prevent this problem from occurring.  A mild laxative (Milk of Magnesia or Miralax) should be taken according to package instructions if there are no bowel movements after 48 hours. °7. Unless discharge instructions indicate otherwise, you may remove your bandages 24-48 hours after surgery, and you may shower at that time.  You may have steri-strips (small skin tapes) in place directly over the incision.  These strips should be left on the skin for 7-10 days.  If your surgeon used skin glue on the incision, you may shower in 24 hours.  The glue will flake off over the next 2-3 weeks.  Any sutures or  staples will be removed at the office during your follow-up visit. °8. ACTIVITIES:  You may resume regular (light) daily activities beginning the next day--such as daily self-care, walking, climbing stairs--gradually increasing activities as tolerated.  You may have sexual intercourse when it is comfortable.  Refrain from any heavy lifting or straining until approved by your doctor. °a. You may drive when you are no longer taking prescription pain medication, you can comfortably wear a seatbelt, and you can safely maneuver your car and apply brakes. °b. RETURN TO WORK:  __________________________________________________________ °9. You should see your doctor in the office for a follow-up appointment approximately 2-3 weeks after your surgery.  Make sure that you call for this appointment within a day or two after you arrive home to insure a convenient appointment time. °10. OTHER INSTRUCTIONS: __________________________________________________________________________________________________________________________ __________________________________________________________________________________________________________________________ °WHEN TO CALL YOUR DOCTOR: °1. Fever over 101.0 °2. Inability to urinate °3. Continued bleeding from incision. °4. Increased pain, redness, or drainage from the incision. °5. Increasing abdominal pain ° °The clinic staff is available to answer your questions during regular business hours.  Please don’t hesitate to call and ask to speak to one of the nurses for clinical concerns.  If you have a medical emergency, go to the nearest emergency room or call 911.  A surgeon from [facility] is always on call at the hospital. °[address], [address], [place], [place]  [address] ? [address] [address], , [place]   [address] °[phone] ? [phone] ? FAX [phone] °Web site:   [email or web address] °

## 2012-09-18 ENCOUNTER — Encounter (INDEPENDENT_AMBULATORY_CARE_PROVIDER_SITE_OTHER): Payer: Self-pay | Admitting: Internal Medicine

## 2012-09-18 MED ORDER — KETOROLAC TROMETHAMINE 10 MG PO TABS: 10.0000 mg | ORAL_TABLET | Freq: Three times a day (TID) | ORAL | Status: DC | PRN

## 2012-09-18 MED ORDER — KETOROLAC TROMETHAMINE 10 MG PO TABS
10.0000 mg | ORAL_TABLET | Freq: Three times a day (TID) | ORAL | Status: DC | PRN
  Filled 2012-09-18: qty 1

## 2012-09-18 MED ORDER — OXYCODONE HCL 5 MG PO TABS
5.0000 mg | ORAL_TABLET | ORAL | Status: DC | PRN
  Administered 2012-09-18: 10 mg via ORAL
  Filled 2012-09-18: qty 2

## 2012-09-18 MED ORDER — OXYCODONE HCL 5 MG PO TABS: 5.0000 mg | ORAL_TABLET | ORAL | Status: DC | PRN

## 2012-09-18 NOTE — Discharge Summary (Signed)
Savannah Lyons. Corliss Skains, MD, [facility]  09/18/2012 11:19 AM

## 2012-09-18 NOTE — Progress Notes (Signed)
Pt for d/c home today. IV d/c'd. Dermabond dressing intact to abdomen. Pain to abdomen relieved with Oxy IR as claimed. Ambulates without difficulties. D/C instructions & RX given with verbalized understanding. Mother at bedside to assist with d/c.

## 2012-09-26 ENCOUNTER — Telehealth (INDEPENDENT_AMBULATORY_CARE_PROVIDER_SITE_OTHER): Payer: Self-pay | Admitting: *Deleted

## 2012-09-26 NOTE — Telephone Encounter (Signed)
Patient called to state that she is having painful, itchy blisters on the bottom of bilateral feet.  Patient spoke with the on call MD last night and tried the Benadryl last night and this morning with no relief.  Patient states she has not taken her Oxycodone in over 36 hours.  Spoke to [employer] MD who along with this RN does not feel like these symptoms are related to her surgery.  Patient's surgery was 1 week ago and symptoms began yesterday.  Patient encouraged to go see PMD or dermatologist regarding symptoms.

## 2012-10-09 ENCOUNTER — Ambulatory Visit (INDEPENDENT_AMBULATORY_CARE_PROVIDER_SITE_OTHER): Payer: BC Managed Care – PPO | Admitting: General Surgery

## 2012-10-09 ENCOUNTER — Encounter (INDEPENDENT_AMBULATORY_CARE_PROVIDER_SITE_OTHER): Payer: Self-pay | Admitting: General Surgery

## 2012-10-09 VITALS — BP 118/78 | HR 84 | Resp 18 | Ht 68.75 in | Wt 192.0 lb

## 2012-10-09 DIAGNOSIS — K8000 Calculus of gallbladder with acute cholecystitis without obstruction: Secondary | ICD-10-CM

## 2012-10-09 NOTE — Patient Instructions (Signed)
Continue to use tylenol or ibuprofen for pain/soreness.  Call if you have a problem.

## 2012-10-09 NOTE — Progress Notes (Signed)
KATYA ROLSTON [birth year] [id number] 10/09/2012   Elanie Hammitt is a 37 y.o. female who had a laparoscopic cholecystectomy with intraoperative by Dr. Johna Sheriff.  The pathology report confirmed chronic cholecystitis and cholelithiasis.  The patient reports that they are feeling well with normal bowel movements and good appetite.  The pre-operative symptoms of abdominal pain, nausea, and vomiting have resolved.    Physical examination - Incisions appear well-healed with no sign of infection or bleeding.   BP 118/78  Pulse 84  Resp 18  Ht 5' 8.75" (1.746 m)  Wt 87.091 kg (192 lb)  BMI 28.57 kg/m2  LMP 09/05/2012 Abdomen - soft, non-tender She still has some soreness in RUQ but not tender she is eating,BM's are almost normal.  Incisions well healed.  Impression:  s/p laparoscopic cholecystectomy  Plan:  She may resume a regular diet and full activity.  She may follow-up on a PRN basis.

## 2015-05-08 ENCOUNTER — Encounter: Payer: Self-pay | Admitting: Internal Medicine

## 2015-05-08 ENCOUNTER — Ambulatory Visit (INDEPENDENT_AMBULATORY_CARE_PROVIDER_SITE_OTHER): Payer: Self-pay | Admitting: Internal Medicine

## 2015-05-08 VITALS — BP 136/94 | HR 84 | Ht 68.0 in | Wt 238.2 lb

## 2015-05-08 DIAGNOSIS — J309 Allergic rhinitis, unspecified: Secondary | ICD-10-CM

## 2015-05-08 DIAGNOSIS — J302 Other seasonal allergic rhinitis: Secondary | ICD-10-CM | POA: Insufficient documentation

## 2015-05-08 DIAGNOSIS — G4733 Obstructive sleep apnea (adult) (pediatric): Secondary | ICD-10-CM

## 2015-05-08 DIAGNOSIS — J3089 Other allergic rhinitis: Secondary | ICD-10-CM

## 2015-05-08 DIAGNOSIS — J342 Deviated nasal septum: Secondary | ICD-10-CM

## 2015-05-08 MED ORDER — ZOLPIDEM TARTRATE 5 MG PO TABS: 5.0000 mg | ORAL_TABLET | Freq: Every evening | ORAL | Status: DC | PRN

## 2015-05-08 MED ORDER — AZELASTINE-FLUTICASONE 137-50 MCG/ACT NA SUSP: 2.0000 | Freq: Every day | NASAL | Status: DC

## 2015-05-08 NOTE — Progress Notes (Signed)
Subjective:    Patient ID: Savannah Lyons, female    DOB: 06-14-1976, 39 y.o.   MRN: [id number]  HPI   05/08/15- 72 yoF former smoker Referred by [facility] for management of obstructive sleep apnea. HST done x 3 weeks ago. Epworth Score: 9 Unattended Home Sleep Test 04/08/2015-severe OSA, AHI 41.0, desaturation to 82%, body weight 235 pounds She has a history of snoring and witnessed apneas. She sometimes awakens sitting up as if trying to get her breath. Daytime sleepiness. Drinks a couple of cups of coffee and a soft drink most days. Weight up 20 pounds in the last 2 years. Rare difficulty initiating and maintaining sleep for which she takes one half Xanax if needed. ENT-broken nose 20 years ago-mouth breather. Sinus surgery at that time. Seasonal allergic rhinitis She tried an over-the-counter mouthpiece and tried nasal strips without benefit.  Prior to Admission medications   Medication Sig Start Date End Date Taking? Authorizing Provider  ALPRAZolam (XANAX PO) Take by mouth as needed.    Yes Historical Provider, MD  Cetirizine HCl (ZYRTEC ALLERGY PO) Take by mouth.   Yes Historical Provider, MD  citalopram (CELEXA) 40 MG tablet Take 40 mg by mouth daily.   Yes Historical Provider, MD  ibuprofen (ADVIL,MOTRIN) 200 MG tablet Take 400 mg by mouth every 6 (six) hours as needed for pain.   Yes Historical Provider, MD  Azelastine-Fluticasone (DYMISTA) 137-50 MCG/ACT SUSP Place 2 sprays into both nostrils at bedtime. 05/08/15   Waymon Budge, MD  zolpidem (AMBIEN) 5 MG tablet Take 1 tablet (5 mg total) by mouth at bedtime as needed for sleep. 05/08/15 06/07/15  Waymon Budge, MD   Past Medical History  Diagnosis Date  . Smoking     never smoked  . Seasonal allergies    Past Surgical History  Procedure Laterality Date  . Cholecystectomy N/A 09/16/2012    Procedure: LAPAROSCOPIC CHOLECYSTECTOMY ;  Surgeon: Mariella Saa, MD;  Location: [facility];  Service: General;   Laterality: N/A;  . Rhinoplasty    . Wisdom tooth extraction  1992  . Knee arthroscopy with lateral release  2009   Family History  Problem Relation Age of Onset  . Diabetes Mother   . Clotting disorder Father    Social History   Social History  . Marital Status: Single    Spouse Name: N/A  . Number of Children: N/A  . Years of Education: N/A   Occupational History  . Logistics sales    Social History Main Topics  . Smoking status: Former Smoker -- 0.50 packs/day for 1 years    Types: Cigarettes    Quit date: 06/27/2012  . Smokeless tobacco: Not on file     Comment: 5-6 cigs daily  . Alcohol Use: 1.8 oz/week    3 Glasses of wine per week     Comment: Weekly.  . Drug Use: No  . Sexual Activity: Not on file   Other Topics Concern  . Not on file   Social History Narrative    Review of Systems  Constitutional: Negative for fever and unexpected weight change.  HENT: Positive for congestion, dental problem and sneezing. Negative for ear pain, nosebleeds, postnasal drip, rhinorrhea, sinus pressure, sore throat and trouble swallowing.   Eyes: Negative for redness and itching.  Respiratory: Positive for cough and shortness of breath. Negative for chest tightness and wheezing.   Cardiovascular: Negative for palpitations and leg swelling.  Gastrointestinal: Negative for nausea and vomiting.  Acid Heartburn  Genitourinary: Negative for dysuria.  Musculoskeletal: Negative for joint swelling.  Skin: Positive for rash ( itching).  Neurological: Positive for headaches.  Hematological: Does not bruise/bleed easily.  Psychiatric/Behavioral: Positive for dysphoric mood. The patient is nervous/anxious.        Objective:   Physical Exam OBJ- Physical Exam General- Alert, Oriented, Affect-appropriate, Distress- none acute Skin- rash-none, lesions- none, excoriation- none Lymphadenopathy- none Head- atraumatic            Eyes- Gross vision intact, PERRLA, conjunctivae and  secretions clear            Ears- Hearing, canals-normal            Nose- + turbinate edema, Septal dev +, mucus +, no-polyps, erosion, perforation             Throat- Mallampati IV , mucosa clear , drainage- none, tonsils- atrophic Neck- flexible , trachea midline, no stridor , thyroid nl, carotid no bruit Chest - symmetrical excursion , unlabored           Heart/CV- RRR , no murmur , no gallop  , no rub, nl s1 s2                           - JVD- none , edema- none, stasis changes- none, varices- none           Lung- clear to P&A, wheeze- none, cough- none , dullness-none, rub- none           Chest wall-  Abd-  Br/ Gen/ Rectal- Not done, not indicated Extrem- cyanosis- none, clubbing, none, atrophy- none, strength- nl Neuro- grossly intact to observation     Assessment & Plan:

## 2015-05-08 NOTE — Assessment & Plan Note (Signed)
Old trauma surgically repaired with residual narrowing left greater than right

## 2015-05-08 NOTE — Assessment & Plan Note (Signed)
The basic physiology and medical concerns were outlined and treatment issues discussed. Questions answered. Plan-weight loss is encouraged. Driving responsibility emphasized. Start CPAP with auto titration

## 2015-05-08 NOTE — Assessment & Plan Note (Signed)
This will aggravate control of sleep apnea Plan-sample Dymista nasal spray for trial

## 2015-05-08 NOTE — Patient Instructions (Signed)
Script for [employer] to help with sleep if needed  Order- new DME, new CPAP auto 5-20, mask of choice, humidifier, supplies, AirView    Dx OSA  Sample Dymista nasal spray   Try 1-2  Puffs each nostril once daily at bedtime

## 2015-08-13 ENCOUNTER — Institutional Professional Consult (permissible substitution): Payer: Self-pay | Admitting: Internal Medicine

## 2015-08-13 ENCOUNTER — Encounter: Payer: Self-pay | Admitting: Internal Medicine

## 2015-08-13 ENCOUNTER — Ambulatory Visit (INDEPENDENT_AMBULATORY_CARE_PROVIDER_SITE_OTHER): Payer: Commercial Managed Care - PPO | Admitting: Internal Medicine

## 2015-08-13 VITALS — BP 124/78 | HR 105 | Ht 68.0 in | Wt 226.8 lb

## 2015-08-13 DIAGNOSIS — J342 Deviated nasal septum: Secondary | ICD-10-CM | POA: Diagnosis not present

## 2015-08-13 DIAGNOSIS — J309 Allergic rhinitis, unspecified: Secondary | ICD-10-CM | POA: Diagnosis not present

## 2015-08-13 DIAGNOSIS — G4733 Obstructive sleep apnea (adult) (pediatric): Secondary | ICD-10-CM

## 2015-08-13 NOTE — Patient Instructions (Signed)
See if you can use the CPAP a little more consistently- our goal is at least 4 hours per night and ultimately- all night/ every night  Try using the Dymista nasal spray 1-2 puffs ech nostril once daily at bedtime  Order- referral to [facility]    Dx rhinitis and long palate/ tonsils complicating management of OSA  Please call as needed

## 2015-08-13 NOTE — Assessment & Plan Note (Signed)
We reviewed her download and discussed usage goals. She is going to try to identify what discomforts causes her to take her mask off during the night.

## 2015-08-13 NOTE — Progress Notes (Signed)
Subjective:    Patient ID: Savannah Lyons, female    DOB: 17-Mar-1976, 40 y.o.   MRN: [id number]  HPI   05/08/15- 64 yoF former smoker Referred by [facility] for management of obstructive sleep apnea. HST done x 3 weeks ago. Epworth Score: 9 Unattended Home Sleep Test 04/08/2015-severe OSA, AHI 41.0, desaturation to 82%, body weight 235 pounds She has a history of snoring and witnessed apneas. She sometimes awakens sitting up as if trying to get her breath. Daytime sleepiness. Drinks a couple of cups of coffee and a soft drink most days. Weight up 20 pounds in the last 2 years. Rare difficulty initiating and maintaining sleep for which she takes one half Xanax if needed. ENT-broken nose 20 years ago-mouth breather. Sinus surgery at that time. Seasonal allergic rhinitis She tried an over-the-counter mouthpiece and tried nasal strips without benefit.  08/13/2015-40 year old female former smoker followed for OSA, complicated by allergic rhinitis, nasal septal deviation CPAP auto 5-20/Advanced FOLLOWS FOR: DME: [facility] Pt wears CPAP every night for about 4-6 hours. DL attached Download shows she is not using CPAP as much as we would like and control was not optimal. I reviewed with her. 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 Tonsils were recently enlarged with acute pharyngitis.  Review of Systems  Constitutional: Negative for fever and unexpected weight change.  HENT: Positive for congestion, dental problem and sneezing. Negative for ear pain, nosebleeds, postnasal drip, rhinorrhea, sinus pressure, sore throat and trouble swallowing.   Eyes: Negative for redness and itching.  Respiratory: Positive for cough and shortness of breath. Negative for chest tightness and wheezing.   Cardiovascular: Negative for palpitations and leg swelling.  Gastrointestinal: Negative for nausea and vomiting.       Acid Heartburn  Genitourinary: Negative for  dysuria.  Musculoskeletal: Negative for joint swelling.  Skin: Positive for rash ( itching).  Neurological: Positive for headaches.  Hematological: Does not bruise/bleed easily.  Psychiatric/Behavioral: Positive for dysphoric mood. The patient is nervous/anxious.      Objective:   Physical Exam OBJ- Physical Exam General- Alert, Oriented, Affect-appropriate, Distress- none acute Skin- rash-none, lesions- none, excoriation- none Lymphadenopathy- none Head- atraumatic            Eyes- Gross vision intact, PERRLA, conjunctivae and secretions clear            Ears- Hearing, canals-normal            Nose- + turbinate edema, Septal dev +, mucus +, no-polyps, erosion, perforation             Throat- Mallampati IV , mucosa clear , drainage- none, tonsils + present Neck- flexible , trachea midline, no stridor , thyroid nl, carotid no bruit Chest - symmetrical excursion , unlabored           Heart/CV- RRR , no murmur , no gallop  , no rub, nl s1 s2                           - JVD- none , edema- none, stasis changes- none, varices- none           Lung- clear to P&A, wheeze- none, cough- none , dullness-none, rub- none           Chest wall-  Abd-  Br/ Gen/ Rectal- Not done, not indicated Extrem- cyanosis- none, clubbing, none, atrophy- none, strength- nl Neuro- grossly intact to observation  Assessment & Plan:

## 2015-08-13 NOTE — Assessment & Plan Note (Signed)
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 If these were surgically correctable I think her tolerance for CPAP would improve. Plan-referral to ENT so that she can discuss what might be done to improve her nasopharyngeal airway in the context of OSA. We hope at least improve her tolerance of CPAP.

## 2015-08-27 ENCOUNTER — Other Ambulatory Visit (HOSPITAL_BASED_OUTPATIENT_CLINIC_OR_DEPARTMENT_OTHER): Payer: Self-pay | Admitting: Occupational Medicine

## 2015-08-27 DIAGNOSIS — M545 Low back pain: Secondary | ICD-10-CM

## 2015-09-05 ENCOUNTER — Ambulatory Visit (HOSPITAL_BASED_OUTPATIENT_CLINIC_OR_DEPARTMENT_OTHER)
Admission: RE | Admit: 2015-09-05 | Discharge: 2015-09-05 | Disposition: A | Discharge status: Home / Self Care | Payer: Commercial Managed Care - PPO | Source: Ambulatory Visit | Attending: Occupational Medicine | Admitting: Occupational Medicine

## 2015-09-05 DIAGNOSIS — M545 Low back pain: Secondary | ICD-10-CM

## 2015-09-05 DIAGNOSIS — M5127 Other intervertebral disc displacement, lumbosacral region: Secondary | ICD-10-CM | POA: Insufficient documentation

## 2015-09-05 DIAGNOSIS — M4806 Spinal stenosis, lumbar region: Secondary | ICD-10-CM | POA: Diagnosis not present

## 2015-11-05 ENCOUNTER — Encounter (HOSPITAL_COMMUNITY): Payer: Self-pay | Admitting: *Deleted

## 2015-11-05 ENCOUNTER — Inpatient Hospital Stay (HOSPITAL_COMMUNITY): Payer: Commercial Managed Care - PPO

## 2015-11-05 ENCOUNTER — Inpatient Hospital Stay (HOSPITAL_COMMUNITY)
Admission: AD | Admit: 2015-11-05 | Discharge: 2015-11-06 | Disposition: A | Discharge status: Home / Self Care | Payer: Commercial Managed Care - PPO | Source: Ambulatory Visit | Attending: Obstetrics and Gynecology | Admitting: Obstetrics and Gynecology

## 2015-11-05 DIAGNOSIS — N8302 Follicular cyst of left ovary: Secondary | ICD-10-CM | POA: Insufficient documentation

## 2015-11-05 DIAGNOSIS — Z87891 Personal history of nicotine dependence: Secondary | ICD-10-CM | POA: Insufficient documentation

## 2015-11-05 DIAGNOSIS — R1032 Left lower quadrant pain: Secondary | ICD-10-CM | POA: Diagnosis present

## 2015-11-05 DIAGNOSIS — R109 Unspecified abdominal pain: Secondary | ICD-10-CM

## 2015-11-05 DIAGNOSIS — N8300 Follicular cyst of ovary, unspecified side: Secondary | ICD-10-CM

## 2015-11-05 DIAGNOSIS — Z79899 Other long term (current) drug therapy: Secondary | ICD-10-CM | POA: Diagnosis not present

## 2015-11-05 DIAGNOSIS — Z885 Allergy status to narcotic agent status: Secondary | ICD-10-CM | POA: Insufficient documentation

## 2015-11-05 LAB — URINALYSIS, ROUTINE W REFLEX MICROSCOPIC
BILIRUBIN URINE: NEGATIVE
GLUCOSE, UA: NEGATIVE mg/dL
KETONES UR: NEGATIVE mg/dL
LEUKOCYTES UA: NEGATIVE
Nitrite: NEGATIVE
PROTEIN: NEGATIVE mg/dL
Specific Gravity, Urine: 1.01 (ref 1.005–1.030)
pH: 6.5 (ref 5.0–8.0)

## 2015-11-05 LAB — URINE MICROSCOPIC-ADD ON

## 2015-11-05 LAB — CBC WITH DIFFERENTIAL/PLATELET
BASOS ABS: 0 10*3/uL (ref 0.0–0.1)
BASOS PCT: 0 %
Eosinophils Absolute: 0.1 10*3/uL (ref 0.0–0.7)
Eosinophils Relative: 1 %
HEMATOCRIT: 40.2 % (ref 36.0–46.0)
HEMOGLOBIN: 13.9 g/dL (ref 12.0–15.0)
LYMPHS PCT: 28 %
Lymphs Abs: 5.3 10*3/uL — ABNORMAL HIGH (ref 0.7–4.0)
MCH: 30.5 pg (ref 26.0–34.0)
MCHC: 34.6 g/dL (ref 30.0–36.0)
MCV: 88.2 fL (ref 78.0–100.0)
MONOS PCT: 7 %
Monocytes Absolute: 1.2 10*3/uL — ABNORMAL HIGH (ref 0.1–1.0)
NEUTROS ABS: 12.1 10*3/uL — AB (ref 1.7–7.7)
NEUTROS PCT: 64 %
Platelets: 426 10*3/uL — ABNORMAL HIGH (ref 150–400)
RBC: 4.56 MIL/uL (ref 3.87–5.11)
RDW: 14.1 % (ref 11.5–15.5)
WBC: 18.9 10*3/uL — ABNORMAL HIGH (ref 4.0–10.5)

## 2015-11-05 LAB — WET PREP, GENITAL
CLUE CELLS WET PREP: NONE SEEN
SPERM: NONE SEEN
TRICH WET PREP: NONE SEEN
Yeast Wet Prep HPF POC: NONE SEEN

## 2015-11-05 LAB — POCT PREGNANCY, URINE: Preg Test, Ur: NEGATIVE

## 2015-11-05 MED ORDER — IBUPROFEN 600 MG PO TABS
600.0000 mg | ORAL_TABLET | Freq: Once | ORAL | Status: AC
  Administered 2015-11-05: 600 mg via ORAL
  Filled 2015-11-05: qty 1

## 2015-11-05 NOTE — MAU Note (Signed)
Pt reports she has had a LLQ pain for 3-4 days. Went to doctor at work and r/o  UTI and pregnancy. Told to come here for further evaluation

## 2015-11-05 NOTE — MAU Provider Note (Signed)
History     CSN: [id number]  Arrival date and time: 11/05/15 1734   First Provider Initiated Contact with Patient 11/05/15 2109      Chief Complaint  Patient presents with  . Abdominal Pain   HPI   Savannah Lyons is a 40 y.o. female [id number] presenting to MAU with lower abdominal, the pain started 2-3 days ago. The pain is located in her LLQ , however radiates to to entire lower abdomen.  She has never had this pain before. She has not taken anything for the pain. She currently rates her pain 7/10.  She was seen by a provider at her work place and they ruled out UTI and pregnancy. She has an appointment with [facility] on Monday morning but wanted to be seen here first just to make sure everything was ok.   She has an IUD in place and had it placed 2012  Denies vaginal bleeding currently   Last period was 2-3 weeks ago-normal for her.   Eating and drinking normally; patient had a candy bar while in the lobby and her sister if bringing food up to her.     OB History    Gravida Para Term Preterm AB TAB SAB Ectopic Multiple Living   2 0 0       2      Past Medical History  Diagnosis Date  . Smoking     never smoked  . Seasonal allergies     Past Surgical History  Procedure Laterality Date  . Cholecystectomy N/A 09/16/2012    Procedure: LAPAROSCOPIC CHOLECYSTECTOMY ;  Surgeon: Mariella Saa, MD;  Location: [facility];  Service: General;  Laterality: N/A;  . Rhinoplasty    . Wisdom tooth extraction  1992  . Knee arthroscopy with lateral release  2009    Family History  Problem Relation Age of Onset  . Diabetes Mother   . Clotting disorder Father     Social History  Substance Use Topics  . Smoking status: Former Smoker -- 0.50 packs/day for 1 years    Types: Cigarettes    Quit date: 06/27/2012  . Smokeless tobacco: None     Comment: 5-6 cigs daily  . Alcohol Use: 1.8 oz/week    3 Glasses of wine per week     Comment: Weekly.    Allergies:   Allergies  Allergen Reactions  . Codeine Nausea Only    Prescriptions prior to admission  Medication Sig Dispense Refill Last Dose  . ALPRAZolam (XANAX PO) Take by mouth as needed.    Taking  . Azelastine-Fluticasone (DYMISTA) 137-50 MCG/ACT SUSP Place 2 sprays into both nostrils at bedtime. 1 Bottle 0 Taking  . Cetirizine HCl (ZYRTEC ALLERGY PO) Take by mouth.   Taking  . citalopram (CELEXA) 40 MG tablet Take 40 mg by mouth daily.   Taking  . ibuprofen (ADVIL,MOTRIN) 200 MG tablet Take 400 mg by mouth every 6 (six) hours as needed for pain.   Taking  . zolpidem (AMBIEN) 5 MG tablet Take 1 tablet (5 mg total) by mouth at bedtime as needed for sleep. 30 tablet 5 Taking   Results for orders placed or performed during the hospital encounter of 11/05/15 (from the past 48 hour(s))  Pregnancy, urine POC     Status: None   Collection Time: 11/05/15  6:52 PM  Result Value Ref Range   Preg Test, Ur NEGATIVE NEGATIVE    Comment:        THE SENSITIVITY OF  THIS METHODOLOGY IS >24 mIU/mL   Urinalysis, Routine w reflex microscopic (not at [facility])     Status: Abnormal   Collection Time: 11/05/15  6:55 PM  Result Value Ref Range   Color, Urine YELLOW YELLOW   APPearance CLEAR CLEAR   Specific Gravity, Urine 1.010 1.005 - 1.030   pH 6.5 5.0 - 8.0   Glucose, UA NEGATIVE NEGATIVE mg/dL   Hgb urine dipstick MODERATE (A) NEGATIVE   Bilirubin Urine NEGATIVE NEGATIVE   Ketones, ur NEGATIVE NEGATIVE mg/dL   Protein, ur NEGATIVE NEGATIVE mg/dL   Nitrite NEGATIVE NEGATIVE   Leukocytes, UA NEGATIVE NEGATIVE  Urine microscopic-add on     Status: Abnormal   Collection Time: 11/05/15  6:55 PM  Result Value Ref Range   Squamous Epithelial / LPF 0-5 (A) NONE SEEN   WBC, UA 0-5 0 - 5 WBC/hpf   RBC / HPF 0-5 0 - 5 RBC/hpf   Bacteria, UA RARE (A) NONE SEEN  CBC with Differential     Status: Abnormal (Preliminary result)   Collection Time: 11/05/15  9:43 PM  Result Value Ref Range   WBC 18.9 (H) 4.0 -  10.5 K/uL   RBC 4.56 3.87 - 5.11 MIL/uL   Hemoglobin 13.9 12.0 - 15.0 g/dL   HCT 95.6 21.3 - 08.6 %   MCV 88.2 78.0 - 100.0 fL   MCH 30.5 26.0 - 34.0 pg   MCHC 34.6 30.0 - 36.0 g/dL   RDW 57.8 46.9 - 62.9 %   Platelets 426 (H) 150 - 400 K/uL   Neutrophils Relative % 64 %   Neutro Abs 12.1 (H) 1.7 - 7.7 K/uL   Lymphocytes Relative 28 %   Lymphs Abs 5.3 (H) 0.7 - 4.0 K/uL   Monocytes Relative 7 %   Monocytes Absolute 1.2 (H) 0.1 - 1.0 K/uL   Eosinophils Relative 1 %   Eosinophils Absolute 0.1 0.0 - 0.7 K/uL   Basophils Relative 0 %   Basophils Absolute 0.0 0.0 - 0.1 K/uL   Other PENDING %    Review of Systems  Constitutional: Negative for fever and chills.  Respiratory: Negative for shortness of breath.   Cardiovascular: Negative for chest pain.  Gastrointestinal: Positive for abdominal pain. Negative for nausea, vomiting, diarrhea and constipation.  Genitourinary: Positive for dysuria.  Neurological: Negative for headaches.   Physical Exam   Blood pressure 138/87, pulse 94, temperature 98.4 F (36.9 C), resp. rate 18, height 5\' 8"  (1.727 m), weight 215 lb 1.9 oz (97.578 kg), last menstrual period 10/23/2015.  Physical Exam  Constitutional: She is oriented to person, place, and time. She appears well-developed and well-nourished. No distress.  Cardiovascular: Normal rate and normal heart sounds.   Respiratory: Effort normal and breath sounds normal.  GI: Soft. Normal appearance. There is tenderness in the suprapubic area and left lower quadrant. There is no rigidity, no rebound and no guarding.  Genitourinary:  Speculum exam: Vagina - Small amount of creamy discharge, no odor Cervix - No contact bleeding Bimanual exam: Cervix closed, No CMT  Uterus non tender, normal size Right adnexal tenderness with suprapubic tenderness  GC/Chlam, wet prep done Chaperone present for exam.  Neurological: She is alert and oriented to person, place, and time.  Skin: She is not  diaphoretic.  Psychiatric: Her behavior is normal.   Results for orders placed or performed during the hospital encounter of 11/05/15 (from the past 24 hour(s))  Pregnancy, urine POC     Status: None  Collection Time: 11/05/15  6:52 PM  Result Value Ref Range   Preg Test, Ur NEGATIVE NEGATIVE  Urinalysis, Routine w reflex microscopic (not at [facility])     Status: Abnormal   Collection Time: 11/05/15  6:55 PM  Result Value Ref Range   Color, Urine YELLOW YELLOW   APPearance CLEAR CLEAR   Specific Gravity, Urine 1.010 1.005 - 1.030   pH 6.5 5.0 - 8.0   Glucose, UA NEGATIVE NEGATIVE mg/dL   Hgb urine dipstick MODERATE (A) NEGATIVE   Bilirubin Urine NEGATIVE NEGATIVE   Ketones, ur NEGATIVE NEGATIVE mg/dL   Protein, ur NEGATIVE NEGATIVE mg/dL   Nitrite NEGATIVE NEGATIVE   Leukocytes, UA NEGATIVE NEGATIVE  Urine microscopic-add on     Status: Abnormal   Collection Time: 11/05/15  6:55 PM  Result Value Ref Range   Squamous Epithelial / LPF 0-5 (A) NONE SEEN   WBC, UA 0-5 0 - 5 WBC/hpf   RBC / HPF 0-5 0 - 5 RBC/hpf   Bacteria, UA RARE (A) NONE SEEN  Wet prep, genital     Status: Abnormal   Collection Time: 11/05/15  9:18 PM  Result Value Ref Range   Yeast Wet Prep HPF POC NONE SEEN NONE SEEN   Trich, Wet Prep NONE SEEN NONE SEEN   Clue Cells Wet Prep HPF POC NONE SEEN NONE SEEN   WBC, Wet Prep HPF POC MODERATE (A) NONE SEEN   Sperm NONE SEEN   CBC with Differential     Status: Abnormal   Collection Time: 11/05/15  9:43 PM  Result Value Ref Range   WBC 18.9 (H) 4.0 - 10.5 K/uL   RBC 4.56 3.87 - 5.11 MIL/uL   Hemoglobin 13.9 12.0 - 15.0 g/dL   HCT 16.1 09.6 - 04.5 %   MCV 88.2 78.0 - 100.0 fL   MCH 30.5 26.0 - 34.0 pg   MCHC 34.6 30.0 - 36.0 g/dL   RDW 40.9 81.1 - 91.4 %   Platelets 426 (H) 150 - 400 K/uL   Neutrophils Relative % 64 %   Neutro Abs 12.1 (H) 1.7 - 7.7 K/uL   Lymphocytes Relative 28 %   Lymphs Abs 5.3 (H) 0.7 - 4.0 K/uL   Monocytes Relative 7 %   Monocytes  Absolute 1.2 (H) 0.1 - 1.0 K/uL   Eosinophils Relative 1 %   Eosinophils Absolute 0.1 0.0 - 0.7 K/uL   Basophils Relative 0 %   Basophils Absolute 0.0 0.0 - 0.1 K/uL   US Transvaginal Non-ob  11/06/2015  CLINICAL DATA:  Acute onset left pelvic lower quadrant pain 2-3 days ago. LMP 10/23/2015. EXAM: TRANSABDOMINAL AND TRANSVAGINAL ULTRASOUND OF PELVIS DOPPLER ULTRASOUND OF OVARIES TECHNIQUE: Both transabdominal and transvaginal ultrasound examinations of the pelvis were performed. Transabdominal technique was performed for global imaging of the pelvis including uterus, ovaries, adnexal regions, and pelvic cul-de-sac. It was necessary to proceed with endovaginal exam following the transabdominal exam to visualize the endometrial stripe and ovaries. Color and duplex Doppler ultrasound was utilized to evaluate blood flow to the ovaries. COMPARISON:  None. FINDINGS: Uterus Measurements: 9.7 x 4.7 x 6.2 cm. No fibroids or other mass visualized. Endometrium Thickness: 3 mm.  IUD visualized within the endometrial cavity. Right ovary Measurements: 2.6 x 1.9 x 1.0 cm. Normal appearance/no adnexal mass. Left ovary Measurements: 3.9 x 3.8 x 3.3 cm. A 3.4 cm simple left follicular cyst is noted. Pulsed Doppler evaluation of both ovaries demonstrates normal low-resistance arterial and venous waveforms. Other findings  No abnormal free fluid. IMPRESSION: No evidence of uterine fibroids. IUD visualized within the endometrial cavity. 3.4 cm benign-appearing left ovarian follicular cyst. Normal appearance of right ovary. No sonographic evidence for ovarian torsion. Electronically Signed   By: Myles RosenthalJohn  Stahl M.D.   On: 11/06/2015 00:06   [place] Pelvis Complete  11/06/2015  CLINICAL DATA:  Acute onset left pelvic lower quadrant pain 2-3 days ago. LMP 10/23/2015. EXAM: TRANSABDOMINAL AND TRANSVAGINAL ULTRASOUND OF PELVIS DOPPLER ULTRASOUND OF OVARIES TECHNIQUE: Both transabdominal and transvaginal ultrasound examinations of the pelvis  were performed. Transabdominal technique was performed for global imaging of the pelvis including uterus, ovaries, adnexal regions, and pelvic cul-de-sac. It was necessary to proceed with endovaginal exam following the transabdominal exam to visualize the endometrial stripe and ovaries. Color and duplex Doppler ultrasound was utilized to evaluate blood flow to the ovaries. COMPARISON:  None. FINDINGS: Uterus Measurements: 9.7 x 4.7 x 6.2 cm. No fibroids or other mass visualized. Endometrium Thickness: 3 mm.  IUD visualized within the endometrial cavity. Right ovary Measurements: 2.6 x 1.9 x 1.0 cm. Normal appearance/no adnexal mass. Left ovary Measurements: 3.9 x 3.8 x 3.3 cm. A 3.4 cm simple left follicular cyst is noted. Pulsed Doppler evaluation of both ovaries demonstrates normal low-resistance arterial and venous waveforms. Other findings No abnormal free fluid. IMPRESSION: No evidence of uterine fibroids. IUD visualized within the endometrial cavity. 3.4 cm benign-appearing left ovarian follicular cyst. Normal appearance of right ovary. No sonographic evidence for ovarian torsion. Electronically Signed   By: Myles RosenthalJohn  Stahl M.D.   On: 11/06/2015 00:06   [place] Art/ven Flow Abd Pelv Doppler  11/06/2015  CLINICAL DATA:  Acute onset left pelvic lower quadrant pain 2-3 days ago. LMP 10/23/2015. EXAM: TRANSABDOMINAL AND TRANSVAGINAL ULTRASOUND OF PELVIS DOPPLER ULTRASOUND OF OVARIES TECHNIQUE: Both transabdominal and transvaginal ultrasound examinations of the pelvis were performed. Transabdominal technique was performed for global imaging of the pelvis including uterus, ovaries, adnexal regions, and pelvic cul-de-sac. It was necessary to proceed with endovaginal exam following the transabdominal exam to visualize the endometrial stripe and ovaries. Color and duplex Doppler ultrasound was utilized to evaluate blood flow to the ovaries. COMPARISON:  None. FINDINGS: Uterus Measurements: 9.7 x 4.7 x 6.2 cm. No fibroids or  other mass visualized. Endometrium Thickness: 3 mm.  IUD visualized within the endometrial cavity. Right ovary Measurements: 2.6 x 1.9 x 1.0 cm. Normal appearance/no adnexal mass. Left ovary Measurements: 3.9 x 3.8 x 3.3 cm. A 3.4 cm simple left follicular cyst is noted. Pulsed Doppler evaluation of both ovaries demonstrates normal low-resistance arterial and venous waveforms. Other findings No abnormal free fluid. IMPRESSION: No evidence of uterine fibroids. IUD visualized within the endometrial cavity. 3.4 cm benign-appearing left ovarian follicular cyst. Normal appearance of right ovary. No sonographic evidence for ovarian torsion. Electronically Signed   By: Myles RosenthalJohn  Stahl M.D.   On: 11/06/2015 00:06    MAU Course  Procedures  None  MDM  Patient denies history of hypertension.  CBC Wet prep GC Ibuprofen 600 mg PO Due to elevated WBC > 18,000 will proceed with pelvic US.  Report given to Thressa ShellerHeather Hogan CNM who resumes care of the patient @2200   Duane LopeJennifer I Rasch, NP   Assessment and Plan   1. Follicular cyst of ovary   2. Abdominal pain   3. Left lower quadrant pain    DC home Comfort measures reviewed  RX: none  Return to MAU as needed FU with OB as planned  Follow-up Information  Schedule an appointment as soon as possible for a visit with Genia Del, MD.   Specialty:  Obstetrics and Gynecology   Contact information:   [address] [place] [address] [phone]          Duane Lope, NP 11/05/2015 9:10 PM

## 2015-11-06 DIAGNOSIS — N8300 Follicular cyst of ovary, unspecified side: Secondary | ICD-10-CM

## 2015-11-06 LAB — GC/CHLAMYDIA PROBE AMP (~~LOC~~) NOT AT ARMC
CHLAMYDIA, DNA PROBE: NEGATIVE
NEISSERIA GONORRHEA: NEGATIVE

## 2015-11-06 NOTE — Discharge Instructions (Signed)

## 2015-11-07 LAB — URINE CULTURE

## 2016-02-10 ENCOUNTER — Encounter: Payer: Self-pay | Admitting: Internal Medicine

## 2016-02-11 ENCOUNTER — Ambulatory Visit (INDEPENDENT_AMBULATORY_CARE_PROVIDER_SITE_OTHER): Payer: Commercial Managed Care - PPO | Admitting: Internal Medicine

## 2016-02-11 ENCOUNTER — Encounter: Payer: Self-pay | Admitting: Internal Medicine

## 2016-02-11 VITALS — BP 138/82 | HR 77 | Ht 68.0 in | Wt 221.0 lb

## 2016-02-11 DIAGNOSIS — J342 Deviated nasal septum: Secondary | ICD-10-CM | POA: Diagnosis not present

## 2016-02-11 DIAGNOSIS — G4733 Obstructive sleep apnea (adult) (pediatric): Secondary | ICD-10-CM | POA: Diagnosis not present

## 2016-02-11 DIAGNOSIS — J3089 Other allergic rhinitis: Secondary | ICD-10-CM

## 2016-02-11 DIAGNOSIS — J309 Allergic rhinitis, unspecified: Secondary | ICD-10-CM | POA: Diagnosis not present

## 2016-02-11 DIAGNOSIS — J302 Other seasonal allergic rhinitis: Secondary | ICD-10-CM

## 2016-02-11 MED ORDER — AZELASTINE-FLUTICASONE 137-50 MCG/ACT NA SUSP: NASAL | 12 refills | Status: DC

## 2016-02-11 NOTE — Assessment & Plan Note (Signed)
Perennial allergic rhinitis. Uncertain impact of septal deviation for which she would like ENT opinion Plan- Rx Dymista nasal spray;    ENT referral

## 2016-02-11 NOTE — Progress Notes (Signed)
Subjective:    Patient ID: Pryor MontesCatherine Lyons, female    DOB: 11-09-1975, 40 y.o.   MRN: [id number]  HPI   05/08/15- [age] yoF former smoker [facility] obstructive sleep apnea. HST done x 3 weeks ago. Epworth Score: 9 Unattended Home Sleep Test 04/08/2015-severe OSA, AHI 41.0, desaturation to 82%, body weight 235 pounds She has a history of snoring and witnessed apneas. She sometimes awakens sitting up as if trying to get her breath. Daytime sleepiness. Drinks a couple of cups of coffee and a soft drink most days. Weight up 20 pounds in the last 2 years. Rare difficulty initiating and maintaining sleep for which she takes one half Xanax if needed. ENT-broken nose 20 years ago-mouth breather. Sinus surgery at that time. Seasonal allergic rhinitis She tried an over-the-counter mouthpiece and tried nasal strips without benefit.  08/13/2015-40 year old female former smoker followed for OSA, complicated by allergic rhinitis, nasal septal deviation CPAP auto 5-20/[facility] FOLLOWS FOR: DME: [facility] Pt wears CPAP every night for about 4-6 hours. DL attached Download shows she is not using CPAP as much as we would like and control was not optimal. I reviewed with her. 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 Tonsils were recently enlarged with acute pharyngitis.  02/11/2016-40 year old female former smoker followed for OSA complicated by allergic rhinitis, is a septal deviation CPAP auto 5-20/[facility] Follows for: AR, OSA. Pt c/o increased allergy symptoms with nasal congestion. Pt states that she was given sample of Dymista and it has worked well. Pt states that she wears CPAP nightly and feels that she sleeps well.  Using a nasal mask which she sometimes pulls off at night. Perennial nasal stuffiness and drainage associated with her complaint of deviated septum. She asks ENT referral for evaluation. Denies pressure discomfort  but does occasionally get gassy from swallowed air with CPAP so we suggested trying pressure reduction. Download confirms satisfactory compliance 83%/4 hours, fair control/AHI 7.1.  ROS-see HPI   Negative unless "+" Constitutional:    weight loss, night sweats, fevers, chills, fatigue, lassitude. HEENT:    headaches, difficulty swallowing, tooth/dental problems, sore throat,       sneezing, itching, ear ache, +nasal congestion, +post nasal drip, snoring CV:    chest pain, orthopnea, PND, swelling in lower extremities, anasarca,                                                   dizziness, palpitations Resp:   shortness of breath with exertion or at rest.                productive cough,   non-productive cough, coughing up of blood.              change in color of mucus.  wheezing.   Skin:    rash or lesions. GI:  No-   heartburn, indigestion, abdominal pain, nausea, vomiting, diarrhea,                 change in bowel habits, loss of appetite GU: dysuria, change in color of urine, no urgency or frequency.   flank pain. MS:   joint pain, stiffness, decreased range of motion, back pain. Neuro-     nothing unusual Psych:  change in mood or affect.  depression or anxiety.  memory loss.        Objective:   Physical Exam OBJ- Physical Exam General- Alert, Oriented, Affect-appropriate, Distress- none acute Skin- rash-none, lesions- none, excoriation- none Lymphadenopathy- none Head- atraumatic            Eyes- Gross vision intact, PERRLA, conjunctivae and secretions clear            Ears- Hearing, canals-normal            Nose- + turbinate edema, Septal dev +, mucus +, no-polyps, erosion, perforation             Throat- Mallampati IV , mucosa clear , drainage- none, tonsils + present Neck- flexible , trachea midline, no stridor , thyroid nl, carotid no bruit Chest - symmetrical excursion , unlabored           Heart/CV- RRR , no murmur , no gallop  , no rub, nl s1 s2                            - JVD- none , edema- none, stasis changes- none, varices- none           Lung- clear to P&A, wheeze- none, cough- none , dullness-none, rub- none           Chest wall-  Abd-  Br/ Gen/ Rectal- Not done, not indicated Extrem- cyanosis- none, clubbing, none, atrophy- none, strength- nl Neuro- grossly intact to observation     Assessment & Plan:

## 2016-02-11 NOTE — Assessment & Plan Note (Signed)
Our first goal needs to be helping her be comfortable with full use of CPAP every night. Plan-try reducing pressure range to auto 5-18. OTC simethicone if needed for gas.

## 2016-02-11 NOTE — Assessment & Plan Note (Signed)
ENT referral

## 2016-02-11 NOTE — Patient Instructions (Addendum)
Script sent for Dymista nasal spray  Order- DME [facility]- please change auto pap range to 5- 18, continue mask of choice, humidifier, supplies, AirView   Dx OSA  [facility]    Dx nasal septal deviation  Please call as needed

## 2018-07-29 ENCOUNTER — Encounter: Payer: Self-pay | Admitting: Emergency Medicine

## 2018-07-29 ENCOUNTER — Emergency Department (INDEPENDENT_AMBULATORY_CARE_PROVIDER_SITE_OTHER): Payer: BLUE CROSS/BLUE SHIELD

## 2018-07-29 ENCOUNTER — Emergency Department
Admission: EM | Admit: 2018-07-29 | Discharge: 2018-07-29 | Disposition: A | Discharge status: Home / Self Care | Payer: BLUE CROSS/BLUE SHIELD | Source: Home / Self Care | Attending: Family Medicine | Admitting: Family Medicine

## 2018-07-29 ENCOUNTER — Other Ambulatory Visit: Payer: Self-pay

## 2018-07-29 DIAGNOSIS — J181 Lobar pneumonia, unspecified organism: Secondary | ICD-10-CM

## 2018-07-29 DIAGNOSIS — R05 Cough: Secondary | ICD-10-CM | POA: Diagnosis not present

## 2018-07-29 DIAGNOSIS — J189 Pneumonia, unspecified organism: Secondary | ICD-10-CM

## 2018-07-29 HISTORY — DX: Sleep apnea, unspecified: G47.30

## 2018-07-29 HISTORY — DX: Essential (primary) hypertension: I10

## 2018-07-29 MED ORDER — CEFDINIR 300 MG PO CAPS: 300.0000 mg | ORAL_CAPSULE | Freq: Two times a day (BID) | ORAL | 0 refills | Status: DC

## 2018-07-29 MED ORDER — BENZONATATE 200 MG PO CAPS: ORAL_CAPSULE | ORAL | 0 refills | Status: DC

## 2018-07-29 NOTE — Discharge Instructions (Addendum)
Take plain guaifenesin (1200mg  extended release tabs such as Mucinex) twice daily, with plenty of water, for cough and congestion.  May add Pseudoephedrine (30mg , one or two every 4 to 6 hours) for sinus congestion.  Get adequate rest.   May use Afrin nasal spray (or generic oxymetazoline) each morning for about 5 days and then discontinue.  Also recommend using saline nasal spray several times daily and saline nasal irrigation (AYR is a common brand).  Use Flonase nasal spray each morning after using Afrin nasal spray and saline nasal irrigation. Try warm salt water gargles for sore throat.  Stop all antihistamines for now, and other non-prescription cough/cold preparations. May take Ibuprofen 200mg , 4 tabs every 8 hours with food for chest discomfort. May take Delsym Cough Suppressant with Tessalon at bedtime for nighttime cough.   If symptoms become significantly worse during the night or over the weekend, proceed to the local emergency room.

## 2018-07-29 NOTE — ED Provider Notes (Signed)
[facility]    CSN: [id number] Arrival date & time: 07/29/18  1448     History   Chief Complaint Chief Complaint  Patient presents with  . Cough  . Nasal Congestion  . Shortness of Breath    HPI Savannah Lyons is a 43 y.o. female.   Patient developed mild URI symptoms one week ago, with increasing cough and fatigue during the past four days.  During the past two days she has had night sweats and headache.  She complains of "rattling" in her upper chest. Patient notes that she had a flu-like illness about a month ago that lasted five days.  Approximately two weeks later she developed another milder URI that resolved except for persistent mild cough. She has a history of seasonal rhinitis.  The history is provided by the patient.    Past Medical History:  Diagnosis Date  . Hypertension   . Seasonal allergies   . Sleep apnea   . Smoking    never smoked    Patient Active Problem List   Diagnosis Date Noted  . Obstructive sleep apnea 05/08/2015  . Seasonal and perennial allergic rhinitis 05/08/2015  . Nasal septal deviation 05/08/2015    Past Surgical History:  Procedure Laterality Date  . CHOLECYSTECTOMY N/A 09/16/2012   Procedure: LAPAROSCOPIC CHOLECYSTECTOMY ;  Surgeon: Mariella Saa, MD;  Location: [facility];  Service: General;  Laterality: N/A;  . KNEE ARTHROSCOPY WITH LATERAL RELEASE  2009  . RHINOPLASTY    . WISDOM TOOTH EXTRACTION  1992    OB History    Gravida  2   Para  0   Term  0   Preterm      AB      Living  2     SAB      TAB      Ectopic      Multiple      Live Births  2            Home Medications    Prior to Admission medications   Medication Sig Start Date End Date Taking? Authorizing Provider  Azelastine-Fluticasone [facility]) 137-50 MCG/ACT SUSP 1-2 puffs each nostril once daily at bedtime 02/11/16   Jetty Duhamel D, MD  benzonatate (TESSALON) 200 MG capsule Take one cap by mouth at bedtime as needed  for cough.  May repeat in 4 to 6 hours 07/29/18   Lattie Haw, MD  cefdinir (OMNICEF) 300 MG capsule Take 1 capsule (300 mg total) by mouth 2 (two) times daily. 07/29/18   Lattie Haw, MD  Cetirizine HCl (ZYRTEC ALLERGY PO) Take 1 tablet by mouth daily as needed (allergies).     [provider]  citalopram (CELEXA) 40 MG tablet Take 40 mg by mouth daily.    [provider]  zolpidem (AMBIEN) 5 MG tablet Take 5 mg by mouth at bedtime as needed for sleep.    [provider]    Family History Family History  Problem Relation Age of Onset  . Diabetes Mother   . Clotting disorder Father     Social History Social History   Tobacco Use  . Smoking status: Former Smoker    Packs/day: 0.50    Years: 1.00    Pack years: 0.50    Types: Cigarettes    Last attempt to quit: 06/27/2012    Years since quitting: 6.0  . Smokeless tobacco: Never Used  . Tobacco comment: 5-6 cigs daily  Substance Use  Topics  . Alcohol use: Yes    Alcohol/week: 3.0 standard drinks    Types: 3 Glasses of wine per week    Comment: Weekly.  . Drug use: No     Allergies   Codeine   Review of Systems Review of Systems No sore throat + cough No pleuritic pain ? wheezing + nasal congestion + post-nasal drainage No sinus pain/pressure No itchy/red eyes No earache No hemoptysis No SOB No fever, + chills/sweats No nausea No vomiting No abdominal pain No diarrhea No urinary symptoms No skin rash + fatigue No myalgias + headache Used OTC meds without relief   Physical Exam Triage Vital Signs ED Triage Vitals  Enc Vitals Group     BP 07/29/18 1529 (!) 144/91     Pulse Rate 07/29/18 1529 94     Resp 07/29/18 1529 18     Temp 07/29/18 1529 98.3 F (36.8 C)     Temp Source 07/29/18 1529 Oral     SpO2 07/29/18 1529 98 %     Weight 07/29/18 1531 220 lb (99.8 kg)     Height 07/29/18 1531 5\' 8"  (1.727 m)     Head Circumference --      Peak Flow --      Pain Score  07/29/18 1530 0     Pain Loc --      Pain Edu? --      Excl. in GC? --    No data found.  Updated Vital Signs BP (!) 144/91 (BP Location: Right Arm) Comment: not taken HTN rxs for past few days  Pulse 94   Temp 98.3 F (36.8 C) (Oral)   Resp 18   Ht 5\' 8"  (1.727 m)   Wt 99.8 kg   SpO2 98%   BMI 33.45 kg/m   Visual Acuity Right Eye Distance:   Left Eye Distance:   Bilateral Distance:    Right Eye Near:   Left Eye Near:    Bilateral Near:     Physical Exam Nursing notes and Vital Signs reviewed. Appearance:  Patient appears stated age, and in no acute distress Eyes:  Pupils are equal, round, and reactive to light and accomodation.  Extraocular movement is intact.  Conjunctivae are not inflamed  Ears:  Canals normal.  Tympanic membranes normal.  Nose:  Mildly congested turbinates.  No sinus tenderness.  Pharynx:  Normal Neck:  Supple.  Enlarged posterior/lateral nodes are palpated bilaterally, tender to palpation on the left.   Lungs:   Scattered faint rhonchi.  Breath sounds are equal.  Moving air well. Heart:  Regular rate and rhythm without murmurs, rubs, or gallops.  Abdomen:  Nontender without masses or hepatosplenomegaly.  Bowel sounds are present.  No CVA or flank tenderness.  Extremities:  No edema.  Skin:  No rash present.    UC Treatments / Results  Labs (all labs ordered are listed, but only abnormal results are displayed) Labs Reviewed - No data to display  EKG None  Radiology Dg Chest 2 View  Result Date: 07/29/2018 CLINICAL DATA:  Intermittent cough for 1 month, worsening over the last several days. EXAM: CHEST - 2 VIEW COMPARISON:  09/16/2012 FINDINGS: There are peribronchovascular opacities in the lower lobes, right middle lobe and left upper lobe lingula. Mild airspace opacities noted in the anterior lung base on the lateral view, most likely in the right middle lobe. These findings are a change from the prior chest radiograph. Remainder of the lungs  is clear.  No pleural effusion or pneumothorax. Cardiac silhouette is normal in size. Normal mediastinal and hilar contours. Skeletal structures are intact. IMPRESSION: 1. Findings consistent with bilateral lower lung bronchial inflammation with an area of airspace opacity, consistent with pneumonia, in the anterior lung base on the lateral view, most likely the right middle lobe. 2. No evidence of pulmonary edema.  No other abnormalities. Electronically Signed   By: Amie Portland M.D.   On: 07/29/2018 16:14    Procedures Procedures (including critical care time)  Medications Ordered in UC Medications - No data to display  Initial Impression / Assessment and Plan / UC Course  I have reviewed the triage vital signs and the nursing notes.  Pertinent labs & imaging results that were available during my care of the patient were reviewed by me and considered in my medical decision making (see chart for details).    Begin Omnicef. Followup with PCP in about 2 weeks for repeat chest X-ray.   Final Clinical Impressions(s) / UC Diagnoses   Final diagnoses:  Pneumonia of both lower lobes due to infectious organism [facility])     Discharge Instructions     Take plain guaifenesin (1200mg  extended release tabs such as Mucinex) twice daily, with plenty of water, for cough and congestion.  May add Pseudoephedrine (30mg , one or two every 4 to 6 hours) for sinus congestion.  Get adequate rest.   May use Afrin nasal spray (or generic oxymetazoline) each morning for about 5 days and then discontinue.  Also recommend using saline nasal spray several times daily and saline nasal irrigation (AYR is a common brand).  Use Flonase nasal spray each morning after using Afrin nasal spray and saline nasal irrigation. Try warm salt water gargles for sore throat.  Stop all antihistamines for now, and other non-prescription cough/cold preparations. May take Ibuprofen 200mg , 4 tabs every 8 hours with food for chest  discomfort. May take Delsym Cough Suppressant with Tessalon at bedtime for nighttime cough.   If symptoms become significantly worse during the night or over the weekend, proceed to the local emergency room.     ED Prescriptions    Medication Sig Dispense Auth. Provider   cefdinir (OMNICEF) 300 MG capsule Take 1 capsule (300 mg total) by mouth 2 (two) times daily. 20 capsule Lattie Haw, MD   benzonatate (TESSALON) 200 MG capsule Take one cap by mouth at bedtime as needed for cough.  May repeat in 4 to 6 hours 15 capsule Cathren Harsh Tera Mater, MD        Lattie Haw, MD 07/30/18 [phone]

## 2018-07-29 NOTE — ED Triage Notes (Signed)
Patient reports illnesses for past month; exacerbated 3-4 days ago with sever cough and 'rattling' in upper chest.

## 2018-07-31 ENCOUNTER — Telehealth: Payer: Self-pay

## 2018-07-31 MED ORDER — PREDNISONE 50 MG PO TABS: ORAL_TABLET | ORAL | 0 refills | Status: DC

## 2018-07-31 NOTE — Telephone Encounter (Signed)
Pt states she is feeling very fatigued. Inquired about Prednisone. Per Denny Peon, ok to send in Prednisone 50mg  1 PO daily for 5 days.

## 2018-12-02 ENCOUNTER — Other Ambulatory Visit: Payer: Self-pay

## 2018-12-02 ENCOUNTER — Emergency Department (HOSPITAL_BASED_OUTPATIENT_CLINIC_OR_DEPARTMENT_OTHER)
Admission: EM | Admit: 2018-12-02 | Discharge: 2018-12-02 | Disposition: A | Discharge status: Home / Self Care | Payer: BC Managed Care – PPO | Attending: Emergency Medicine | Admitting: Emergency Medicine

## 2018-12-02 ENCOUNTER — Encounter (HOSPITAL_BASED_OUTPATIENT_CLINIC_OR_DEPARTMENT_OTHER): Payer: Self-pay | Admitting: Emergency Medicine

## 2018-12-02 ENCOUNTER — Emergency Department (HOSPITAL_BASED_OUTPATIENT_CLINIC_OR_DEPARTMENT_OTHER): Payer: BC Managed Care – PPO

## 2018-12-02 DIAGNOSIS — Y9389 Activity, other specified: Secondary | ICD-10-CM | POA: Insufficient documentation

## 2018-12-02 DIAGNOSIS — Y9289 Other specified places as the place of occurrence of the external cause: Secondary | ICD-10-CM | POA: Insufficient documentation

## 2018-12-02 DIAGNOSIS — W010XXA Fall on same level from slipping, tripping and stumbling without subsequent striking against object, initial encounter: Secondary | ICD-10-CM | POA: Insufficient documentation

## 2018-12-02 DIAGNOSIS — S42492A Other displaced fracture of lower end of left humerus, initial encounter for closed fracture: Secondary | ICD-10-CM | POA: Insufficient documentation

## 2018-12-02 DIAGNOSIS — S4992XA Unspecified injury of left shoulder and upper arm, initial encounter: Secondary | ICD-10-CM | POA: Diagnosis present

## 2018-12-02 DIAGNOSIS — Y998 Other external cause status: Secondary | ICD-10-CM | POA: Insufficient documentation

## 2018-12-02 DIAGNOSIS — W19XXXA Unspecified fall, initial encounter: Secondary | ICD-10-CM

## 2018-12-02 MED ORDER — OXYCODONE-ACETAMINOPHEN 5-325 MG PO TABS: 1.0000 | ORAL_TABLET | Freq: Four times a day (QID) | ORAL | 0 refills | Status: DC | PRN

## 2018-12-02 NOTE — ED Provider Notes (Signed)
[facility] EMERGENCY DEPARTMENT Provider Note   CSN: [id number] Arrival date & time: 12/02/18  1310    History   Chief Complaint Chief Complaint  Patient presents with  . Fall    HPI Savannah Lyons is a 43 y.o. female with a history of hypertension and sleep apnea who presents to the emergency department status post mechanical fall shortly PTA w/ complaints of LUE pain. Patient states she stepped backwards, tripped over something, and ultimately fell onto an outstretched L hand. No head injury or LOC. No prodromal sxs. Pain primarily to the L elbow radiating to the wrist/hand. Pain is constant, worse w/ movement which is limited, alleviated some w/ ibuprofen taken shortly PTA. Denies numbness, weakness, neck pain, or back pain. She is R hand dominant.      HPI  Past Medical History:  Diagnosis Date  . Hypertension   . Seasonal allergies   . Sleep apnea   . Smoking    never smoked    Patient Active Problem List   Diagnosis Date Noted  . Obstructive sleep apnea 05/08/2015  . Seasonal and perennial allergic rhinitis 05/08/2015  . Nasal septal deviation 05/08/2015    Past Surgical History:  Procedure Laterality Date  . CHOLECYSTECTOMY N/A 09/16/2012   Procedure: LAPAROSCOPIC CHOLECYSTECTOMY ;  Surgeon: Mariella SaaBenjamin T Hoxworth, MD;  Location: [facility];  Service: General;  Laterality: N/A;  . KNEE ARTHROSCOPY WITH LATERAL RELEASE  2009  . RHINOPLASTY    . WISDOM TOOTH EXTRACTION  1992     OB History    Gravida  2   Para  0   Term  0   Preterm      AB      Living  2     SAB      TAB      Ectopic      Multiple      Live Births  2            Home Medications    Prior to Admission medications   Medication Sig Start Date End Date Taking? Authorizing Provider  citalopram (CELEXA) 40 MG tablet TAKE 1 TABLET BY MOUTH EVERY DAY 09/15/18  Yes [provider]  amoxicillin (AMOXIL) 500 MG capsule Take 500 mg by mouth 2 (two) times daily.  11/10/18   [provider]  cetirizine (ZYRTEC) 10 MG tablet Take by mouth.    [provider]  Cetirizine HCl (ZYRTEC ALLERGY PO) Take 1 tablet by mouth daily as needed (allergies).     [provider]  citalopram (CELEXA) 40 MG tablet Take 40 mg by mouth daily.    [provider]  losartan-hydrochlorothiazide Mauri Reading(HYZAAR) 100-25 MG tablet  07/25/18   [provider]    Family History Family History  Problem Relation Age of Onset  . Diabetes Mother   . Clotting disorder Father     Social History Social History   Tobacco Use  . Smoking status: Former Smoker    Packs/day: 0.50    Years: 1.00    Pack years: 0.50    Types: Cigarettes    Last attempt to quit: 06/27/2012    Years since quitting: 6.4  . Smokeless tobacco: Never Used  . Tobacco comment: 5-6 cigs daily  Substance Use Topics  . Alcohol use: Yes    Alcohol/week: 3.0 standard drinks    Types: 3 Glasses of wine per week    Comment: Weekly.  . Drug use: No  Allergies   Codeine   Review of Systems Review of Systems  Constitutional: Negative for chills, diaphoresis and fever.  Respiratory: Negative for shortness of breath.   Cardiovascular: Negative for chest pain.  Musculoskeletal: Positive for arthralgias and myalgias.  Neurological: Negative for dizziness, syncope, weakness, light-headedness and numbness.     Physical Exam Updated Vital Signs BP 139/90 (BP Location: Right Arm)   Pulse 87   Temp 98.7 F (37.1 C) (Oral)   Resp 18   Ht 5\' 9"  (1.753 m)   Wt 108.9 kg   SpO2 98%   BMI 35.44 kg/m   Physical Exam Vitals signs and nursing note reviewed.  Constitutional:      General: She is not in acute distress.    Appearance: Normal appearance. She is not ill-appearing or toxic-appearing.  HENT:     Head: Normocephalic and atraumatic.     Comments: No raccoon eyes or battle sign. Neck:     Musculoskeletal: Normal range of motion and neck supple.      Comments: No midline tenderness. Cardiovascular:     Rate and Rhythm: Normal rate.     Pulses:          Radial pulses are 2+ on the right side and 2+ on the left side.  Pulmonary:     Effort: No respiratory distress.     Breath sounds: Normal breath sounds.  Chest:     Chest wall: No tenderness.  Abdominal:     General: There is no distension.     Tenderness: There is no abdominal tenderness.  Musculoskeletal:     Comments: Upper extremities: Left elbow is mildly swollen.  No significant open wounds.  No obvious deformity.   Patient has intact AROM throughout with the exception of active left elbow flexion/extension being limited some secondary to patient pain, able to move in each of these directions some.  Able to flex/extend the wrist against resistance.. Tender to palpation to the left elbow diffusely including the medial/lateral epicondyles, olecranon, and radial head.  Tenderness to the wrist diffusely as well as to the second through fifth MCPs/metacarpals.  No anatomical snuffbox tenderness.  Upper extremities are otherwise nontender.  Back: No midline tenderness palpation. Lower extremities: Normal ROM. Nontender   Skin:    General: Skin is warm and dry.     Capillary Refill: Capillary refill takes less than 2 seconds.  Neurological:     Mental Status: She is alert.     Comments: Alert. Clear speech. Sensation grossly intact to bilateral upper extremities. 5/5 symmetric grip strength.  Able to perform okay sign, thumbs up, and cross second/third digits bilaterally.  Ambulatory.   Psychiatric:        Mood and Affect: Mood normal.        Behavior: Behavior normal.      ED Treatments / Results  Labs (all labs ordered are listed, but only abnormal results are displayed) Labs Reviewed - No data to display  EKG None  Radiology Dg Elbow Complete Left  Result Date: 12/02/2018 CLINICAL DATA:  Fall this morning landing on hand and left elbow. EXAM: LEFT ELBOW - COMPLETE 3+  VIEW COMPARISON:  None. FINDINGS: Examination demonstrates a minimally displaced fracture of the distal humeral condylar region. There is displaced anterior fat pad. Remainder the exam is unremarkable. IMPRESSION: Minimally displaced distal humeral condylar fracture. Electronically Signed   By: Elberta Fortisaniel  Boyle M.D.   On: 12/02/2018 14:02   Dg Wrist Complete Left  Result Date: 12/02/2018  CLINICAL DATA:  Fall, pain EXAM: LEFT HAND - COMPLETE 3+ VIEW; LEFT WRIST - COMPLETE 3+ VIEW COMPARISON:  None. FINDINGS: No fracture or dislocation of the left hand or wrist. The carpus is normally aligned. Joint spaces are well preserved. No soft tissue abnormality appreciated. IMPRESSION: No fracture or dislocation of the left hand or wrist. The carpus is normally aligned. Joint spaces are well preserved. No soft tissue abnormality appreciated. Electronically Signed   By: Eddie Candle M.D.   On: 12/02/2018 16:12   Dg Hand Complete Left  Result Date: 12/02/2018 CLINICAL DATA:  Fall, pain EXAM: LEFT HAND - COMPLETE 3+ VIEW; LEFT WRIST - COMPLETE 3+ VIEW COMPARISON:  None. FINDINGS: No fracture or dislocation of the left hand or wrist. The carpus is normally aligned. Joint spaces are well preserved. No soft tissue abnormality appreciated. IMPRESSION: No fracture or dislocation of the left hand or wrist. The carpus is normally aligned. Joint spaces are well preserved. No soft tissue abnormality appreciated. Electronically Signed   By: Eddie Candle M.D.   On: 12/02/2018 16:12    Procedures Procedures (including critical care time)  SPLINT APPLICATION Date/Time: 38/75/64 @ 17:30 Authorized by: Kennith Maes Consent: Verbal consent obtained. Risks and benefits: risks, benefits and alternatives were discussed Consent given by: patient Splint applied by: ED technician Location details: LUE Splint type: posterior long arm Post-procedure: The splinted body part was neurovascularly unchanged following the procedure.  Patient tolerance: Patient tolerated the procedure well with no immediate complications.   Medications Ordered in ED Medications - No data to display   Initial Impression / Assessment and Plan / ED Course  I have reviewed the triage vital signs and the nursing notes.  Pertinent labs & imaging results that were available during my care of the patient were reviewed by me and considered in my medical decision making (see chart for details).  Patient presents to the emergency department status post mechanical fall with complaints of left upper extremity pain.  Nontoxic-appearing, no apparent distress, vitals WNL.  No signs of serious head, neck, back, or intrathoracic/abdominal injury.  Appears to have isolated injuries to the left upper extremity.  Tender primarily to the left elbow but also has some tenderness to the left wrist/hand.  No anatomical snuffbox tenderness.  Neurovascularly intact distally. Xrays w/ Minimally displaced distal humeral condylar fracture. NVI distally. No signs of compartment syndrome. Discussed w/ orthopedic surgeon Dr. Lorin Mercy- recommendation to splint and follow up in clinic this week. Splint applied. Recommendation for PRICE. Short course of percocet for severe pain, [place] Controlled Substance reporting System queried. Follow up with orthopedics. I discussed results, treatment plan, need for follow-up, and return precautions with the patient. Provided opportunity for questions, patient confirmed understanding and is in agreement with plan.   Discussed w/ supervising physician Dr. Billy Fischer who is in agreement.   Final Clinical Impressions(s) / ED Diagnoses   Final diagnoses:  Fall, initial encounter  Closed bicondylar fracture of distal end of left humerus, initial encounter    ED Discharge Orders         Ordered    oxyCODONE-acetaminophen (PERCOCET/ROXICET) 5-325 MG tablet  Every 6 hours PRN     12/02/18 1648           Petrucelli, [place] R, PA-C  12/02/18 1736    Gareth Morgan, MD 12/04/18 1429

## 2018-12-02 NOTE — ED Notes (Signed)
Splint needs to be applied before discharge.

## 2018-12-02 NOTE — ED Notes (Signed)
ED Provider at bedside. 

## 2018-12-02 NOTE — Discharge Instructions (Signed)
Please read and follow all provided instructions.  You have been seen today for an injury to your left elbow.  The x-ray performed today showed that you have a fracture to your distal humerus, this is the area just above the elbow.  We have placed you in a splint for this, please wear it at all times, keep the splint clean and dry.  You need to remain in the splint until you have followed up with orthopedics.  Please call the orthopedic surgeon, Dr. Lorin Mercy and your discharge instructions tomorrow morning to make an appointment for within the next week   Home care instructions: -- *PRICE in the first 24-48 hours after injury: Protect with splint Rest Ice- Do not apply ice pack directly to your splint place towel or similar between your splint and ice/ice pack. Apply ice for 20 min, then remove for 40 min while awake Compression- Wear spilnt Elevate affected extremity above the level of your heart when not walking around for the first 24-48 hours   Medications:  Please take ibuprofen per over-the-counter dosing to help with pain/swelling.  Please take Percocet as needed for pain not alleviated by ibuprofen.  -Percocet-this is a narcotic/controlled substance medication that has potential addicting qualities.  We recommend that you take 1-2 tablets every 6 hours as needed for severe pain.  Do not drive or operate heavy machinery when taking this medicine as it can be sedating. Do not drink alcohol or take other sedating medications when taking this medicine for safety reasons.  Keep this out of reach of small children.  Please be aware this medicine has Tylenol in it (325 mg/tab) do not exceed the maximum dose of Tylenol in a day per over the counter recommendations should you decide to supplement with Tylenol over the counter.   We have prescribed you new medication(s) today. Discuss the medications prescribed today with your pharmacist as they can have adverse effects and interactions with your other  medicines including over the counter and prescribed medications. Seek medical evaluation if you start to experience new or abnormal symptoms after taking one of these medicines, seek care immediately if you start to experience difficulty breathing, feeling of your throat closing, facial swelling, or rash as these could be indications of a more serious allergic reaction  Return instructions:  Please return if your digits or extremity are numb or tingling, appear gray or blue, or you have severe pain (also elevate the extremity and loosen splint or wrap if you were given one) Please return if you have redness or fevers.  Please return to the Emergency Department if you experience worsening symptoms.  Please return if you have any other emergent concerns. Additional Information:  Your vital signs today were: BP 139/90 (BP Location: Right Arm)    Pulse 87    Temp 98.7 F (37.1 C) (Oral)    Resp 18    Ht 5\' 9"  (1.753 m)    Wt 108.9 kg    SpO2 98%    BMI 35.44 kg/m  If your blood pressure (BP) was elevated above 135/85 this visit, please have this repeated by your doctor within one month. ---------------

## 2018-12-02 NOTE — ED Triage Notes (Signed)
Pt fell today. C/o L elbow pain. She is wearing a sling.

## 2018-12-04 ENCOUNTER — Ambulatory Visit (INDEPENDENT_AMBULATORY_CARE_PROVIDER_SITE_OTHER): Payer: BC Managed Care – PPO | Admitting: Family Medicine

## 2018-12-04 ENCOUNTER — Other Ambulatory Visit: Payer: Self-pay

## 2018-12-04 ENCOUNTER — Encounter: Payer: Self-pay | Admitting: Family Medicine

## 2018-12-04 DIAGNOSIS — S42495A Other nondisplaced fracture of lower end of left humerus, initial encounter for closed fracture: Secondary | ICD-10-CM

## 2018-12-04 MED ORDER — OXYCODONE-ACETAMINOPHEN 5-325 MG PO TABS: 1.0000 | ORAL_TABLET | Freq: Four times a day (QID) | ORAL | 0 refills | Status: DC | PRN

## 2018-12-04 MED ORDER — HYDROCODONE-ACETAMINOPHEN 7.5-325 MG PO TABS: 1.0000 | ORAL_TABLET | Freq: Four times a day (QID) | ORAL | 0 refills | Status: DC | PRN

## 2018-12-04 NOTE — Progress Notes (Signed)
Office Visit Note   Patient: Savannah Lyons           Date of Birth: 1976-05-09           MRN: [id number] Visit Date: 12/04/2018 Requested by: Obgyn, Ma HillockWendover [address] [place], [place] [id number] PCP: Obgyn, Ma HillockWendover  Subjective: Chief Complaint  Patient presents with  . Left Elbow - Injury, Pain    DOI 12/02/2018 - fell forward with outstretched hands, injuring left elbow - went to [facility]. In a posterior splint/sling. Right hand dominant.    HPI: She is a [age] year old right-hand-dominant female with left elbow fracture.  2 days ago she fell in her kitchen trying to avoid a falling knife.  She injured her elbow wrist and hand.  She went to [facility] where x-rays revealed a minimally displaced fracture of the distal humeral condylar region.  She was placed in a posterior splint and sling and now presents for follow-up.  No previous problems with her elbow.  She is otherwise in good health.              ROS: No fevers or chills.  All other systems were reviewed and are negative.  Objective: Vital Signs: There were no vitals taken for this visit.  Physical Exam:  General:  Alert and oriented, in no acute distress. Pulm:  Breathing unlabored. Psy:  Normal mood, congruent affect. Skin: Skin was not examined in her left arm at the elbow but hand and fingers are normal. Left arm: She is neurovascularly intact.  Very tender to palpation through her splint but did not remove the splint.  Imaging: X-rays viewed on computer show intra-articular distal humerus fracture with probable slight displacement of lateral epicondyle.  Dr. August Saucerean reviewed the films as well.  Assessment & Plan: 1.  2 days status post fall with left elbow distal humeral condyle fracture, probably intra-articular. -We will proceed with CT scan to assess fracture alignment.  If stable, then probably eventually switch to a Bledsoe brace to immobilize, but still allow for a slight amount of  motion particularly in flexion.  She is at higher risk for heterotopic ossification and elbow stiffness as a result of this fracture. -Follow-up in 1 week, but will discuss scan results when available.     Procedures: No procedures performed  No notes on file     PMFS History: Patient Active Problem List   Diagnosis Date Noted  . Obstructive sleep apnea 05/08/2015  . Seasonal and perennial allergic rhinitis 05/08/2015  . Nasal septal deviation 05/08/2015   Past Medical History:  Diagnosis Date  . Hypertension   . Seasonal allergies   . Sleep apnea   . Smoking    never smoked    Family History  Problem Relation Age of Onset  . Diabetes Mother   . Clotting disorder Father     Past Surgical History:  Procedure Laterality Date  . CHOLECYSTECTOMY N/A 09/16/2012   Procedure: LAPAROSCOPIC CHOLECYSTECTOMY ;  Surgeon: Mariella SaaBenjamin T Hoxworth, MD;  Location: [facility];  Service: General;  Laterality: N/A;  . KNEE ARTHROSCOPY WITH LATERAL RELEASE  2009  . RHINOPLASTY    . WISDOM TOOTH EXTRACTION  1992   Social History   Occupational History  . 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 Smoking status: Former Smoker    Packs/day: 0.50    Years: 1.00    Pack years: 0.50    Types: Cigarettes    Last attempt to quit: 06/27/2012  Years since quitting: 6.4  . Smokeless tobacco: Never Used  . Tobacco comment: 5-6 cigs daily  Substance and Sexual Activity  . Alcohol use: Yes    Alcohol/week: 3.0 standard drinks    Types: 3 Glasses of wine per week    Comment: Weekly.  . Drug use: No  . Sexual activity: Not on file

## 2018-12-05 ENCOUNTER — Ambulatory Visit
Admission: RE | Admit: 2018-12-05 | Discharge: 2018-12-05 | Disposition: A | Discharge status: Home / Self Care | Payer: BC Managed Care – PPO | Source: Ambulatory Visit | Attending: Family Medicine | Admitting: Family Medicine

## 2018-12-05 ENCOUNTER — Telehealth: Payer: Self-pay | Admitting: Family Medicine

## 2018-12-05 DIAGNOSIS — S42495A Other nondisplaced fracture of lower end of left humerus, initial encounter for closed fracture: Secondary | ICD-10-CM

## 2018-12-05 MED ORDER — CELECOXIB 200 MG PO CAPS: 200.0000 mg | ORAL_CAPSULE | Freq: Two times a day (BID) | ORAL | 0 refills | Status: AC

## 2018-12-05 NOTE — Telephone Encounter (Signed)
I spoke to the patient about her CT scan results.  She has a nondisplaced supracondylar fracture ulnar aspect distal humerus, and a condylar fracture of the capitellum with mild impaction and slight rotation.  Dr. Marlou Sa reviewed the films as well.  I think this will do fine without surgical intervention as long as it maintains adequate alignment.  She is at risk for heterotopic ossification as well as elbow stiffness.  We will see her back weekly at first for x-rays to assess fracture alignment.  Next week if she is doing okay, we will switch to a Bledsoe brace locked at 90 degrees but allowing some flexion.  We will treat with Celebrex to prevent heterotopic ossification.

## 2018-12-06 NOTE — Telephone Encounter (Signed)
I called the patient and scheduled a follow up appointment for 12/12/2018 at 1 pm. I advised her we would be getting xrays that day and she would receive an Rx to [facility] for the bledsoe elbow brace for the fitting and setting of position. Also, advised her of the Rx for Celebrex that was sent to her pharmacy.

## 2018-12-12 ENCOUNTER — Ambulatory Visit (INDEPENDENT_AMBULATORY_CARE_PROVIDER_SITE_OTHER): Payer: BC Managed Care – PPO | Admitting: Family Medicine

## 2018-12-12 ENCOUNTER — Other Ambulatory Visit: Payer: Self-pay

## 2018-12-12 ENCOUNTER — Ambulatory Visit: Payer: Self-pay

## 2018-12-12 ENCOUNTER — Encounter: Payer: Self-pay | Admitting: Family Medicine

## 2018-12-12 DIAGNOSIS — S42495D Other nondisplaced fracture of lower end of left humerus, subsequent encounter for fracture with routine healing: Secondary | ICD-10-CM

## 2018-12-12 MED ORDER — OXYCODONE-ACETAMINOPHEN 5-325 MG PO TABS: 1.0000 | ORAL_TABLET | Freq: Four times a day (QID) | ORAL | 0 refills | Status: DC | PRN

## 2018-12-12 NOTE — Progress Notes (Signed)
   Office Visit Note   Patient: Savannah Lyons           Date of Birth: 1975/08/24           MRN: [id number] Visit Date: 12/12/2018 Requested by: Savannah Lyons [address] [place],  [place] [address] PCP: Obgyn, Savannah Lyons  Subjective: Chief Complaint  Patient presents with  . Left Elbow - Follow-up, Injury    DOI 12/02/2018 left distal humerus fracture. In LAS.     HPI: She is about 10 days status post fall resulting in left elbow supracondylar fracture humerus and condylar fracture of capitellum.  The pain is improved, wearing her posterior splint.  Using Celebrex with occasional oxycodone.              ROS: No fevers or chills.  All other systems were reviewed and are negative.  Objective: Vital Signs: There were no vitals taken for this visit.  Physical Exam:  General:  Alert and oriented, in no acute distress. Pulm:  Breathing unlabored. Psy:  Normal mood, congruent affect. Skin: No skin breakdown. Left elbow: 2+ effusion, still tender to palpation over the medial and lateral supracondylar areas.  Imaging: AP, lateral, and oblique view x-rays left elbow:  Fracture alignment seems to be about the same as initial x-rays and CT scan.  Alignment so far appears acceptable.  Dr. Marlou Lyons reviewed them as well.    Assessment & Plan: 1.  Stable 10 days s/p left elbow supracondylar and condylar fractures - Discussed with patient that there's still a chance that she may need surgery if fracture displaces further.  She's also at risk for stiffness and development of HO.   - She will obtain a Bledsoe brace today allowing full flexion but locking extension at 90 degrees. - Return Monday for repeat x-rays.     Procedures: No procedures performed  No notes on file     PMFS History: Patient Active Problem List   Diagnosis Date Noted  . Obstructive sleep apnea 05/08/2015  . Seasonal and perennial allergic rhinitis 05/08/2015  . Nasal septal deviation 05/08/2015   Past  Medical History:  Diagnosis Date  . Hypertension   . Seasonal allergies   . Sleep apnea   . Smoking    never smoked    Family History  Problem Relation Age of Onset  . Diabetes Mother   . Clotting disorder Father     Past Surgical History:  Procedure Laterality Date  . CHOLECYSTECTOMY N/A 09/16/2012   Procedure: LAPAROSCOPIC CHOLECYSTECTOMY ;  Surgeon: Savannah Jolly, MD;  Location: [facility];  Service: General;  Laterality: N/A;  . KNEE ARTHROSCOPY WITH LATERAL RELEASE  2009  . RHINOPLASTY    . [place] EXTRACTION  1992   Social History   Occupational History  . Occupation: Cabin crew  Tobacco Use  . Smoking status: Former Smoker    Packs/day: 0.50    Years: 1.00    Pack years: 0.50    Types: Cigarettes    Quit date: 06/27/2012    Years since quitting: 6.4  . Smokeless tobacco: Never Used  . Tobacco comment: 5-6 cigs daily  Substance and Sexual Activity  . Alcohol use: Yes    Alcohol/week: 3.0 standard drinks    Types: 3 Glasses of wine per week    Comment: Weekly.  . Drug use: No  . Sexual activity: Not on file

## 2018-12-17 ENCOUNTER — Ambulatory Visit (INDEPENDENT_AMBULATORY_CARE_PROVIDER_SITE_OTHER): Payer: BC Managed Care – PPO | Admitting: Family Medicine

## 2018-12-17 ENCOUNTER — Other Ambulatory Visit: Payer: Self-pay

## 2018-12-17 ENCOUNTER — Encounter: Payer: Self-pay | Admitting: Family Medicine

## 2018-12-17 ENCOUNTER — Ambulatory Visit: Payer: BC Managed Care – PPO

## 2018-12-17 DIAGNOSIS — S42495D Other nondisplaced fracture of lower end of left humerus, subsequent encounter for fracture with routine healing: Secondary | ICD-10-CM

## 2018-12-17 MED ORDER — OXYCODONE-ACETAMINOPHEN 5-325 MG PO TABS: 1.0000 | ORAL_TABLET | Freq: Three times a day (TID) | ORAL | 0 refills | Status: DC | PRN

## 2018-12-17 NOTE — Progress Notes (Signed)
   Office Visit Note   Patient: Savannah Lyons           Date of Birth: 26-Sep-1975           MRN: [id number] Visit Date: 12/17/2018 Requested by: Lynnell Chad [address] [place],  [place] [address] PCP: Obgyn, Erling Conte  Subjective: Chief Complaint  Patient presents with  . Left Elbow - Follow-up, Fracture     Now has bledsoe brace (since 12/12/2018) -  at 90 degrees.     HPI: She is about 15 days status post left elbow supracondylar and condylar fractures.  She is in her Bledsoe brace locked at 90 degrees extension, but allowed to flex.  Still in pain but slightly better.              Objective: Vital Signs: There were no vitals taken for this visit.  Physical Exam:  General:  Alert and oriented, in no acute distress. Pulm:  Breathing unlabored. Psy:  Normal mood, congruent affect. Skin: No rash or skin breakdown. Left elbow: Still has 1+ effusion, tender over the medial and lateral epicondyles and supracondylar areas.  Imaging: X-rays left elbow: Alignment appears unchanged from last week.  I will ask Dr. Marlou Sa to review the films as well.    Assessment & Plan: 1.  2-week status post fall with left elbow condylar and supracondylar fractures -Continue in Bledsoe brace, follow-up in 1 week for repeat x-rays.  I will call her after Dr. Marlou Sa has a chance to look at the x-rays.     Procedures: No procedures performed  No notes on file     PMFS History: Patient Active Problem List   Diagnosis Date Noted  . Obstructive sleep apnea 05/08/2015  . Seasonal and perennial allergic rhinitis 05/08/2015  . Nasal septal deviation 05/08/2015   Past Medical History:  Diagnosis Date  . Hypertension   . Seasonal allergies   . Sleep apnea   . Smoking    never smoked    Family History  Problem Relation Age of Onset  . Diabetes Mother   . Clotting disorder Father     Past Surgical History:  Procedure Laterality Date  . CHOLECYSTECTOMY N/A 09/16/2012   Procedure:  LAPAROSCOPIC CHOLECYSTECTOMY ;  Surgeon: Edward Jolly, MD;  Location: [facility];  Service: General;  Laterality: N/A;  . KNEE ARTHROSCOPY WITH LATERAL RELEASE  2009  . RHINOPLASTY    . [place] EXTRACTION  1992   Social History   Occupational History  . Occupation: Cabin crew  Tobacco Use  . Smoking status: Former Smoker    Packs/day: 0.50    Years: 1.00    Pack years: 0.50    Types: Cigarettes    Quit date: 06/27/2012    Years since quitting: 6.4  . Smokeless tobacco: Never Used  . Tobacco comment: 5-6 cigs daily  Substance and Sexual Activity  . Alcohol use: Yes    Alcohol/week: 3.0 standard drinks    Types: 3 Glasses of wine per week    Comment: Weekly.  . Drug use: No  . Sexual activity: Not on file

## 2018-12-19 ENCOUNTER — Telehealth: Payer: Self-pay | Admitting: Family Medicine

## 2018-12-19 NOTE — Telephone Encounter (Signed)
Per our discussion on this.Marland KitchenMarland KitchenMarland Kitchen

## 2018-12-19 NOTE — Telephone Encounter (Signed)
I spoke to patient.  Dr. Marlou Sa spoke to Dr. Marcelino Scot about it and he thinks it needs surgery.  Dr. Marcelino Scot has agreed to see her asap, so please call his office and make the appointment for her.

## 2018-12-19 NOTE — Telephone Encounter (Signed)
Patient stated that she was to hear from ou on Monday and has not heard from you yet.  Please call patient [phone]

## 2018-12-19 NOTE — Telephone Encounter (Signed)
Patient was scheduled for appointment today.  Notes were faxed.

## 2018-12-19 NOTE — Telephone Encounter (Signed)
Has Dr. Marlou Sa reviewed her xrays yet?

## 2018-12-24 ENCOUNTER — Other Ambulatory Visit: Payer: Self-pay

## 2018-12-24 ENCOUNTER — Ambulatory Visit: Payer: BC Managed Care – PPO | Admitting: Family Medicine

## 2018-12-24 ENCOUNTER — Other Ambulatory Visit (HOSPITAL_COMMUNITY)
Admission: RE | Admit: 2018-12-24 | Discharge: 2018-12-24 | Disposition: A | Discharge status: Home / Self Care | Payer: BC Managed Care – PPO | Source: Ambulatory Visit | Attending: Orthopedic Surgery | Admitting: Orthopedic Surgery

## 2018-12-24 ENCOUNTER — Encounter (HOSPITAL_COMMUNITY): Payer: Self-pay

## 2018-12-24 DIAGNOSIS — Z1159 Encounter for screening for other viral diseases: Secondary | ICD-10-CM | POA: Diagnosis not present

## 2018-12-24 DIAGNOSIS — Z01812 Encounter for preprocedural laboratory examination: Secondary | ICD-10-CM | POA: Insufficient documentation

## 2018-12-24 LAB — SARS CORONAVIRUS 2 (TAT 6-24 HRS): SARS Coronavirus 2: NEGATIVE

## 2018-12-25 ENCOUNTER — Ambulatory Visit (HOSPITAL_COMMUNITY): Payer: BC Managed Care – PPO | Admitting: Anesthesiology

## 2018-12-25 ENCOUNTER — Other Ambulatory Visit: Payer: Self-pay

## 2018-12-25 ENCOUNTER — Encounter (HOSPITAL_COMMUNITY): Payer: Self-pay

## 2018-12-25 ENCOUNTER — Observation Stay (HOSPITAL_COMMUNITY)
Admission: RE | Admit: 2018-12-25 | Discharge: 2018-12-27 | Disposition: A | Discharge status: Home / Self Care | Payer: BC Managed Care – PPO | Attending: Orthopedic Surgery | Admitting: Orthopedic Surgery

## 2018-12-25 ENCOUNTER — Encounter (HOSPITAL_COMMUNITY)
Admission: RE | Disposition: A | Discharge status: Home / Self Care | Payer: Self-pay | Source: Home / Self Care | Attending: Orthopedic Surgery

## 2018-12-25 DIAGNOSIS — J302 Other seasonal allergic rhinitis: Secondary | ICD-10-CM | POA: Diagnosis not present

## 2018-12-25 DIAGNOSIS — X58XXXA Exposure to other specified factors, initial encounter: Secondary | ICD-10-CM | POA: Diagnosis not present

## 2018-12-25 DIAGNOSIS — G4730 Sleep apnea, unspecified: Secondary | ICD-10-CM | POA: Insufficient documentation

## 2018-12-25 DIAGNOSIS — S42412A Displaced simple supracondylar fracture without intercondylar fracture of left humerus, initial encounter for closed fracture: Principal | ICD-10-CM | POA: Insufficient documentation

## 2018-12-25 DIAGNOSIS — I10 Essential (primary) hypertension: Secondary | ICD-10-CM | POA: Insufficient documentation

## 2018-12-25 DIAGNOSIS — F419 Anxiety disorder, unspecified: Secondary | ICD-10-CM | POA: Insufficient documentation

## 2018-12-25 DIAGNOSIS — F329 Major depressive disorder, single episode, unspecified: Secondary | ICD-10-CM | POA: Diagnosis not present

## 2018-12-25 DIAGNOSIS — Z87891 Personal history of nicotine dependence: Secondary | ICD-10-CM | POA: Insufficient documentation

## 2018-12-25 DIAGNOSIS — Z79899 Other long term (current) drug therapy: Secondary | ICD-10-CM | POA: Diagnosis not present

## 2018-12-25 DIAGNOSIS — S42402S Unspecified fracture of lower end of left humerus, sequela: Secondary | ICD-10-CM

## 2018-12-25 DIAGNOSIS — Z791 Long term (current) use of non-steroidal anti-inflammatories (NSAID): Secondary | ICD-10-CM | POA: Diagnosis not present

## 2018-12-25 DIAGNOSIS — K219 Gastro-esophageal reflux disease without esophagitis: Secondary | ICD-10-CM | POA: Insufficient documentation

## 2018-12-25 HISTORY — DX: Anxiety disorder, unspecified: F41.9

## 2018-12-25 HISTORY — PX: ORIF HUMERUS FRACTURE: SHX2126

## 2018-12-25 HISTORY — DX: Depression, unspecified: F32.A

## 2018-12-25 LAB — COMPREHENSIVE METABOLIC PANEL
ALT: 43 U/L (ref 0–44)
AST: 32 U/L (ref 15–41)
Albumin: 4.2 g/dL (ref 3.5–5.0)
Alkaline Phosphatase: 87 U/L (ref 38–126)
Anion gap: 14 (ref 5–15)
BUN: 16 mg/dL (ref 6–20)
CO2: 22 mmol/L (ref 22–32)
Calcium: 9.3 mg/dL (ref 8.9–10.3)
Chloride: 102 mmol/L (ref 98–111)
Creatinine, Ser: 0.9 mg/dL (ref 0.44–1.00)
GFR calc Af Amer: >60 mL/min (ref >60)
GFR calc non Af Amer: >60 mL/min (ref >60)
Glucose, Bld: 94 mg/dL (ref 70–99)
Potassium: 3.3 mmol/L — ABNORMAL LOW (ref 3.5–5.1)
Sodium: 138 mmol/L (ref 135–145)
Total Bilirubin: 1.1 mg/dL (ref 0.3–1.2)
Total Protein: 6.9 g/dL (ref 6.5–8.1)

## 2018-12-25 LAB — CBC
HCT: 41.1 % (ref 36.0–46.0)
Hemoglobin: 13.7 g/dL (ref 12.0–15.0)
MCH: 30.9 pg (ref 26.0–34.0)
MCHC: 33.3 g/dL (ref 30.0–36.0)
MCV: 92.6 fL (ref 80.0–100.0)
Platelets: 390 10*3/uL (ref 150–400)
RBC: 4.44 MIL/uL (ref 3.87–5.11)
RDW: 13.3 % (ref 11.5–15.5)
WBC: 9.1 10*3/uL (ref 4.0–10.5)
nRBC: 0 % (ref 0.0–0.2)

## 2018-12-25 LAB — POCT PREGNANCY, URINE: Preg Test, Ur: NEGATIVE

## 2018-12-25 SURGERY — OPEN REDUCTION INTERNAL FIXATION (ORIF) PROXIMAL HUMERUS FRACTURE
Anesthesia: Regional | Laterality: Left

## 2018-12-25 MED ORDER — LACTATED RINGERS IV SOLN
INTRAVENOUS | Status: DC
  Administered 2018-12-26: via INTRAVENOUS

## 2018-12-25 MED ORDER — GLYCOPYRROLATE PF 0.2 MG/ML IJ SOSY
PREFILLED_SYRINGE | INTRAMUSCULAR | Status: DC | PRN
  Administered 2018-12-25: .1 mg via INTRAVENOUS

## 2018-12-25 MED ORDER — HYDROCHLOROTHIAZIDE 25 MG PO TABS
25.0000 mg | ORAL_TABLET | Freq: Every day | ORAL | Status: DC
  Administered 2018-12-25 – 2018-12-27 (×3): 25 mg via ORAL
  Filled 2018-12-25 (×3): qty 1

## 2018-12-25 MED ORDER — MIDAZOLAM HCL 2 MG/2ML IJ SOLN
INTRAMUSCULAR | Status: AC
  Filled 2018-12-25: qty 2

## 2018-12-25 MED ORDER — CHLORHEXIDINE GLUCONATE 4 % EX LIQD: 60.0000 mL | Freq: Once | CUTANEOUS | Status: DC

## 2018-12-25 MED ORDER — CEFAZOLIN SODIUM-DEXTROSE 2-4 GM/100ML-% IV SOLN
2.0000 g | INTRAVENOUS | Status: AC
  Administered 2018-12-25: 13:00:00 2 g via INTRAVENOUS
  Filled 2018-12-25: qty 100

## 2018-12-25 MED ORDER — OMEPRAZOLE MAGNESIUM 20 MG PO TBEC: 20.0000 mg | DELAYED_RELEASE_TABLET | Freq: Every day | ORAL | Status: DC

## 2018-12-25 MED ORDER — MIDAZOLAM HCL 2 MG/2ML IJ SOLN
INTRAMUSCULAR | Status: AC
  Administered 2018-12-25: 2 mg via INTRAVENOUS
  Filled 2018-12-25: qty 2

## 2018-12-25 MED ORDER — PROMETHAZINE HCL 12.5 MG RE SUPP
12.5000 mg | Freq: Four times a day (QID) | RECTAL | Status: DC | PRN
  Filled 2018-12-25: qty 1

## 2018-12-25 MED ORDER — ACETAMINOPHEN 500 MG PO TABS
1000.0000 mg | ORAL_TABLET | Freq: Four times a day (QID) | ORAL | Status: AC
  Administered 2018-12-25 – 2018-12-26 (×4): 1000 mg via ORAL
  Filled 2018-12-25 (×5): qty 2

## 2018-12-25 MED ORDER — CLONIDINE HCL (ANALGESIA) 100 MCG/ML EP SOLN
EPIDURAL | Status: DC | PRN
  Administered 2018-12-25: 100 ug

## 2018-12-25 MED ORDER — ROPIVACAINE HCL 5 MG/ML IJ SOLN
INTRAMUSCULAR | Status: DC | PRN
  Administered 2018-12-25: 20 mL via PERINEURAL

## 2018-12-25 MED ORDER — ACETAMINOPHEN 500 MG PO TABS
ORAL_TABLET | ORAL | Status: AC
  Administered 2018-12-25: 10:00:00 1000 mg via ORAL
  Filled 2018-12-25: qty 2

## 2018-12-25 MED ORDER — OXYCODONE HCL 5 MG PO TABS
10.0000 mg | ORAL_TABLET | ORAL | Status: DC | PRN
  Administered 2018-12-26 (×2): 15 mg via ORAL
  Filled 2018-12-25 (×2): qty 3

## 2018-12-25 MED ORDER — EPHEDRINE 5 MG/ML INJ
INTRAVENOUS | Status: AC
  Filled 2018-12-25: qty 10

## 2018-12-25 MED ORDER — METHOCARBAMOL 500 MG PO TABS
500.0000 mg | ORAL_TABLET | Freq: Four times a day (QID) | ORAL | Status: DC | PRN
  Administered 2018-12-26 – 2018-12-27 (×5): 500 mg via ORAL
  Filled 2018-12-25 (×5): qty 1

## 2018-12-25 MED ORDER — 0.9 % SODIUM CHLORIDE (POUR BTL) OPTIME
TOPICAL | Status: DC | PRN
  Administered 2018-12-25 (×3): 1000 mL

## 2018-12-25 MED ORDER — PANTOPRAZOLE SODIUM 40 MG PO TBEC
40.0000 mg | DELAYED_RELEASE_TABLET | Freq: Every day | ORAL | Status: DC
  Administered 2018-12-25 – 2018-12-26 (×2): 40 mg via ORAL
  Filled 2018-12-25 (×2): qty 1

## 2018-12-25 MED ORDER — ROCURONIUM BROMIDE 10 MG/ML (PF) SYRINGE
PREFILLED_SYRINGE | INTRAVENOUS | Status: AC
  Filled 2018-12-25: qty 10

## 2018-12-25 MED ORDER — VITAMIN C 500 MG PO TABS
1000.0000 mg | ORAL_TABLET | Freq: Every day | ORAL | Status: DC
  Administered 2018-12-25 – 2018-12-27 (×3): 1000 mg via ORAL
  Filled 2018-12-25 (×3): qty 2

## 2018-12-25 MED ORDER — LOSARTAN POTASSIUM-HCTZ 100-25 MG PO TABS: 1.0000 | ORAL_TABLET | Freq: Every day | ORAL | Status: DC

## 2018-12-25 MED ORDER — ALPRAZOLAM 0.5 MG PO TABS
0.5000 mg | ORAL_TABLET | Freq: Four times a day (QID) | ORAL | Status: DC | PRN
  Administered 2018-12-26: 23:00:00 0.5 mg via ORAL
  Filled 2018-12-25: qty 1

## 2018-12-25 MED ORDER — ADULT MULTIVITAMIN W/MINERALS CH
1.0000 | ORAL_TABLET | Freq: Every day | ORAL | Status: DC
  Administered 2018-12-25 – 2018-12-27 (×3): 1 via ORAL
  Filled 2018-12-25 (×3): qty 1

## 2018-12-25 MED ORDER — METHOCARBAMOL 1000 MG/10ML IJ SOLN
500.0000 mg | Freq: Four times a day (QID) | INTRAVENOUS | Status: DC | PRN
  Filled 2018-12-25: qty 5

## 2018-12-25 MED ORDER — CITALOPRAM HYDROBROMIDE 40 MG PO TABS
40.0000 mg | ORAL_TABLET | Freq: Every day | ORAL | Status: DC
  Administered 2018-12-25 – 2018-12-27 (×3): 40 mg via ORAL
  Filled 2018-12-25 (×4): qty 1

## 2018-12-25 MED ORDER — LOSARTAN POTASSIUM 50 MG PO TABS
100.0000 mg | ORAL_TABLET | Freq: Every day | ORAL | Status: DC
  Administered 2018-12-25 – 2018-12-27 (×3): 100 mg via ORAL
  Filled 2018-12-25 (×3): qty 2

## 2018-12-25 MED ORDER — SUCCINYLCHOLINE CHLORIDE 200 MG/10ML IV SOSY
PREFILLED_SYRINGE | INTRAVENOUS | Status: DC | PRN
  Administered 2018-12-25: 200 mg via INTRAVENOUS

## 2018-12-25 MED ORDER — OXYCODONE HCL 5 MG PO TABS
5.0000 mg | ORAL_TABLET | ORAL | Status: DC | PRN
  Administered 2018-12-26 – 2018-12-27 (×6): 10 mg via ORAL
  Filled 2018-12-25 (×7): qty 2

## 2018-12-25 MED ORDER — MIDAZOLAM HCL 2 MG/2ML IJ SOLN
2.0000 mg | Freq: Once | INTRAMUSCULAR | Status: AC
  Administered 2018-12-25: 12:00:00 2 mg via INTRAVENOUS

## 2018-12-25 MED ORDER — ONDANSETRON HCL 4 MG/2ML IJ SOLN
4.0000 mg | Freq: Four times a day (QID) | INTRAMUSCULAR | Status: DC | PRN
  Administered 2018-12-25: 17:00:00 4 mg via INTRAVENOUS

## 2018-12-25 MED ORDER — ONDANSETRON HCL 4 MG/2ML IJ SOLN
INTRAMUSCULAR | Status: AC
  Filled 2018-12-25: qty 2

## 2018-12-25 MED ORDER — ROCURONIUM BROMIDE 10 MG/ML (PF) SYRINGE
PREFILLED_SYRINGE | INTRAVENOUS | Status: DC | PRN
  Administered 2018-12-25: 10 mg via INTRAVENOUS
  Administered 2018-12-25: 50 mg via INTRAVENOUS
  Administered 2018-12-25: 10 mg via INTRAVENOUS

## 2018-12-25 MED ORDER — PROPOFOL 10 MG/ML IV BOLUS
INTRAVENOUS | Status: DC | PRN
  Administered 2018-12-25: 150 mg via INTRAVENOUS

## 2018-12-25 MED ORDER — ONDANSETRON HCL 4 MG/2ML IJ SOLN
INTRAMUSCULAR | Status: DC | PRN
  Administered 2018-12-25: 4 mg via INTRAVENOUS

## 2018-12-25 MED ORDER — ONDANSETRON HCL 4 MG PO TABS: 4.0000 mg | ORAL_TABLET | Freq: Four times a day (QID) | ORAL | Status: DC | PRN

## 2018-12-25 MED ORDER — DEXAMETHASONE SODIUM PHOSPHATE 10 MG/ML IJ SOLN
INTRAMUSCULAR | Status: DC | PRN
  Administered 2018-12-25: 10 mg via INTRAVENOUS

## 2018-12-25 MED ORDER — HYDROMORPHONE HCL 1 MG/ML IJ SOLN
0.5000 mg | INTRAMUSCULAR | Status: DC | PRN
  Administered 2018-12-26 – 2018-12-27 (×3): 1 mg via INTRAVENOUS
  Filled 2018-12-25 (×3): qty 1

## 2018-12-25 MED ORDER — SUGAMMADEX SODIUM 200 MG/2ML IV SOLN
INTRAVENOUS | Status: DC | PRN
  Administered 2018-12-25: 200 mg via INTRAVENOUS

## 2018-12-25 MED ORDER — FENTANYL CITRATE (PF) 100 MCG/2ML IJ SOLN: 25.0000 ug | INTRAMUSCULAR | Status: DC | PRN

## 2018-12-25 MED ORDER — FENTANYL CITRATE (PF) 250 MCG/5ML IJ SOLN
INTRAMUSCULAR | Status: AC
  Filled 2018-12-25: qty 5

## 2018-12-25 MED ORDER — MIDAZOLAM HCL 5 MG/5ML IJ SOLN
INTRAMUSCULAR | Status: DC | PRN
  Administered 2018-12-25: 2 mg via INTRAVENOUS

## 2018-12-25 MED ORDER — CEFAZOLIN SODIUM-DEXTROSE 1-4 GM/50ML-% IV SOLN
1.0000 g | INTRAVENOUS | Status: AC
  Administered 2018-12-25: 21:00:00 1 g via INTRAVENOUS
  Filled 2018-12-25: qty 50

## 2018-12-25 MED ORDER — DOCUSATE SODIUM 100 MG PO CAPS
100.0000 mg | ORAL_CAPSULE | Freq: Two times a day (BID) | ORAL | Status: DC
  Administered 2018-12-25 – 2018-12-27 (×4): 100 mg via ORAL
  Filled 2018-12-25 (×4): qty 1

## 2018-12-25 MED ORDER — LIDOCAINE 2% (20 MG/ML) 5 ML SYRINGE
INTRAMUSCULAR | Status: DC | PRN
  Administered 2018-12-25: 100 mg via INTRAVENOUS

## 2018-12-25 MED ORDER — ACETAMINOPHEN 500 MG PO TABS
1000.0000 mg | ORAL_TABLET | Freq: Once | ORAL | Status: AC
  Administered 2018-12-25: 10:00:00 1000 mg via ORAL

## 2018-12-25 MED ORDER — FENTANYL CITRATE (PF) 100 MCG/2ML IJ SOLN
50.0000 ug | Freq: Once | INTRAMUSCULAR | Status: AC
  Administered 2018-12-25: 12:00:00 50 ug via INTRAVENOUS

## 2018-12-25 MED ORDER — ACETAMINOPHEN 325 MG PO TABS
325.0000 mg | ORAL_TABLET | Freq: Four times a day (QID) | ORAL | Status: DC | PRN
  Administered 2018-12-27: 09:00:00 650 mg via ORAL
  Filled 2018-12-25: qty 2

## 2018-12-25 MED ORDER — FENTANYL CITRATE (PF) 100 MCG/2ML IJ SOLN
INTRAMUSCULAR | Status: AC
  Administered 2018-12-25: 12:00:00 50 ug via INTRAVENOUS
  Filled 2018-12-25: qty 2

## 2018-12-25 MED ORDER — FENTANYL CITRATE (PF) 250 MCG/5ML IJ SOLN
INTRAMUSCULAR | Status: DC | PRN
  Administered 2018-12-25: 50 ug via INTRAVENOUS
  Administered 2018-12-25: 100 ug via INTRAVENOUS
  Administered 2018-12-25 (×2): 50 ug via INTRAVENOUS

## 2018-12-25 MED ORDER — CEFAZOLIN SODIUM-DEXTROSE 1-4 GM/50ML-% IV SOLN
1.0000 g | Freq: Three times a day (TID) | INTRAVENOUS | Status: DC
  Administered 2018-12-26 – 2018-12-27 (×6): 1 g via INTRAVENOUS
  Filled 2018-12-25 (×7): qty 50

## 2018-12-25 MED ORDER — PHENYLEPHRINE 40 MCG/ML (10ML) SYRINGE FOR IV PUSH (FOR BLOOD PRESSURE SUPPORT)
PREFILLED_SYRINGE | INTRAVENOUS | Status: DC | PRN
  Administered 2018-12-25: 120 ug via INTRAVENOUS
  Administered 2018-12-25: 80 ug via INTRAVENOUS

## 2018-12-25 MED ORDER — SODIUM CHLORIDE (PF) 0.9 % IJ SOLN
INTRAMUSCULAR | Status: AC
  Filled 2018-12-25: qty 20

## 2018-12-25 MED ORDER — SODIUM CHLORIDE 0.9 % IV SOLN
INTRAVENOUS | Status: DC | PRN
  Administered 2018-12-25: 13:00:00 20 ug/min via INTRAVENOUS

## 2018-12-25 MED ORDER — EPHEDRINE SULFATE-NACL 50-0.9 MG/10ML-% IV SOSY
PREFILLED_SYRINGE | INTRAVENOUS | Status: DC | PRN
  Administered 2018-12-25 (×2): 10 mg via INTRAVENOUS

## 2018-12-25 MED ORDER — LACTATED RINGERS IV SOLN
INTRAVENOUS | Status: DC
  Administered 2018-12-25 (×2): via INTRAVENOUS

## 2018-12-25 SURGICAL SUPPLY — 81 items
BANDAGE ACE 3X5.8 VEL STRL LF (GAUZE/BANDAGES/DRESSINGS) ×2 IMPLANT
BANDAGE ACE 4X5 VEL STRL LF (GAUZE/BANDAGES/DRESSINGS) ×2 IMPLANT
BIT DRILL 1.8 CANN MAX VPC (BIT) ×2 IMPLANT
BIT DRILL 2.5X2.75 QC CALB (BIT) ×2 IMPLANT
BIT DRILL 4.8X200 CANN (BIT) ×2 IMPLANT
BIT DRILL CALIBRATED 2.7 (BIT) ×1 IMPLANT
BIT DRILL CALIBRATED 2.7MM (BIT) ×1
BLADE AVERAGE 25MMX9MM (BLADE) ×1
BLADE AVERAGE 25X9 (BLADE) ×1 IMPLANT
BNDG ESMARK 4X9 LF (GAUZE/BANDAGES/DRESSINGS) ×2 IMPLANT
BNDG GAUZE ELAST 4 BULKY (GAUZE/BANDAGES/DRESSINGS) ×4 IMPLANT
CABLE CERCLAGE W/NDL CRIMP (Cable) IMPLANT
CABLE CERLAGE W/NEEDLE CRIMP (Cable) ×3 IMPLANT
CLOSURE WOUND 1/2 X4 (GAUZE/BANDAGES/DRESSINGS) ×1
CORD BIPOLAR FORCEPS 12FT (ELECTRODE) ×2 IMPLANT
CUFF TOURN SGL QUICK 18X4 (TOURNIQUET CUFF) ×2 IMPLANT
DRAPE INCISE IOBAN 66X45 STRL (DRAPES) ×5 IMPLANT
DRAPE OEC MINIVIEW 54X84 (DRAPES) ×2 IMPLANT
DRIVER BIT HEX CANN 1.5 (ORTHOPEDIC DISPOSABLE SUPPLIES) ×2 IMPLANT
DRSG ADAPTIC 3X8 NADH LF (GAUZE/BANDAGES/DRESSINGS) ×2 IMPLANT
DRSG EMULSION OIL 3X3 NADH (GAUZE/BANDAGES/DRESSINGS) ×3 IMPLANT
DRSG PAD ABDOMINAL 8X10 ST (GAUZE/BANDAGES/DRESSINGS) ×3 IMPLANT
ELECT REM PT RETURN 9FT ADLT (ELECTROSURGICAL) ×3
ELECTRODE REM PT RTRN 9FT ADLT (ELECTROSURGICAL) ×1 IMPLANT
GAUZE SPONGE 4X4 12PLY STRL (GAUZE/BANDAGES/DRESSINGS) ×3 IMPLANT
GAUZE SPONGE 4X4 12PLY STRL LF (GAUZE/BANDAGES/DRESSINGS) ×2 IMPLANT
GAUZE XEROFORM 5X9 LF (GAUZE/BANDAGES/DRESSINGS) ×2 IMPLANT
GLOVE BIOGEL M 8.0 STRL (GLOVE) ×3 IMPLANT
GOWN STRL REUS W/ TWL LRG LVL3 (GOWN DISPOSABLE) ×2 IMPLANT
GOWN STRL REUS W/ TWL XL LVL3 (GOWN DISPOSABLE) ×2 IMPLANT
GOWN STRL REUS W/TWL LRG LVL3 (GOWN DISPOSABLE) ×4
GOWN STRL REUS W/TWL XL LVL3 (GOWN DISPOSABLE) ×4
K-WIRE ACE 1.6X6 (WIRE) ×6
K-WIRE COCR 0.9X95 (WIRE) ×9
KIT BASIN OR (CUSTOM PROCEDURE TRAY) ×3 IMPLANT
KIT TURNOVER KIT B (KITS) ×3 IMPLANT
KWIRE ACE 1.6X6 (WIRE) IMPLANT
KWIRE COCR 0.9X95 (WIRE) IMPLANT
LOOP VESSEL MAXI BLUE (MISCELLANEOUS) ×2 IMPLANT
MANIFOLD NEPTUNE II (INSTRUMENTS) ×3 IMPLANT
NS IRRIG 1000ML POUR BTL (IV SOLUTION) ×3 IMPLANT
PACK ORTHO EXTREMITY (CUSTOM PROCEDURE TRAY) ×2 IMPLANT
PAD ABD 8X10 STRL (GAUZE/BANDAGES/DRESSINGS) ×2 IMPLANT
PAD ARMBOARD 7.5X6 YLW CONV (MISCELLANEOUS) ×6 IMPLANT
PAD CAST 4YDX4 CTTN HI CHSV (CAST SUPPLIES) IMPLANT
PADDING CAST COTTON 4X4 STRL (CAST SUPPLIES) ×2
PIN GUIDE DRILL TIP 2.8X300 (DRILL) ×2 IMPLANT
PLATE DIST HUM LAT LT SM (Plate) ×2 IMPLANT
PUTTY DBM STAGRAFT PLUS 2CC (Putty) ×2 IMPLANT
SCREW CANNULATED 8.0X90MM (Screw) ×2 IMPLANT
SCREW CORT 3.5X26 (Screw) ×2 IMPLANT
SCREW CORT T15 26X3.5XST LCK (Screw) IMPLANT
SCREW LOCK 3.5X50 DIST TIB (Screw) ×2 IMPLANT
SCREW LOCK CORT STAR 3.5X18 (Screw) ×2 IMPLANT
SCREW LOCK CORT STAR 3.5X20 (Screw) ×2 IMPLANT
SCREW LOCK CORT STAR 3.5X28 (Screw) ×2 IMPLANT
SCREW MAX VPC 2.5MMX26MM (Screw) ×4 IMPLANT
SCREW VPC 2.5X14MM (Screw) ×2 IMPLANT
SCREW VPC 2.5X16MM (Screw) ×4 IMPLANT
SCRUB BETADINE 4OZ XXX (MISCELLANEOUS) ×3 IMPLANT
SLING ARM IMMOBILIZER LRG (SOFTGOODS) ×2 IMPLANT
SOL PREP POV-IOD 4OZ 10% (MISCELLANEOUS) ×3 IMPLANT
STRIP CLOSURE SKIN 1/2X4 (GAUZE/BANDAGES/DRESSINGS) ×2 IMPLANT
SUT BONE WAX W31G (SUTURE) IMPLANT
SUT FIBERWIRE #2 38 REV NDL BL (SUTURE)
SUT MNCRL AB 3-0 PS2 18 (SUTURE) ×3 IMPLANT
SUT VIC AB 0 CT1 27 (SUTURE) ×2
SUT VIC AB 0 CT1 27XBRD ANBCTR (SUTURE) IMPLANT
SUT VIC AB 2-0 CT1 27 (SUTURE) ×4
SUT VIC AB 2-0 CT1 TAPERPNT 27 (SUTURE) IMPLANT
SUT VIC AB 3-0 CT1 27 (SUTURE) ×2
SUT VIC AB 3-0 CT1 TAPERPNT 27 (SUTURE) IMPLANT
SUT VICRYL 4-0 PS2 18IN ABS (SUTURE) ×3 IMPLANT
SUTURE FIBERWR#2 38 REV NDL BL (SUTURE) IMPLANT
SYR CONTROL 10ML LL (SYRINGE) ×3 IMPLANT
TOWEL GREEN STERILE FF (TOWEL DISPOSABLE) ×3 IMPLANT
TOWEL OR 17X26 10 PK STRL BLUE (TOWEL DISPOSABLE) ×3 IMPLANT
WASHER 3.5MM (Orthopedic Implant) ×2 IMPLANT
WASHER 8.0 (Orthopedic Implant) ×2 IMPLANT
WASHER CANN FLAT 8 (Orthopedic Implant) IMPLANT
YANKAUER SUCT BULB TIP NO VENT (SUCTIONS) ×2 IMPLANT

## 2018-12-25 NOTE — Anesthesia Preprocedure Evaluation (Addendum)
Anesthesia Evaluation  Patient identified by MRN, date of birth, ID band Patient awake    Reviewed: Allergy & Precautions, NPO status , Patient's Chart, lab work & pertinent test results  Airway Mallampati: III  TM Distance: >3 FB Neck ROM: Full  Mouth opening: Limited Mouth Opening  Dental no notable dental hx. (+) Teeth Intact, Dental Advisory Given   Pulmonary sleep apnea and Continuous Positive Airway Pressure Ventilation , former smoker,    Pulmonary exam normal breath sounds clear to auscultation       Cardiovascular hypertension, negative cardio ROS Normal cardiovascular exam Rhythm:Regular Rate:Normal     Neuro/Psych PSYCHIATRIC DISORDERS Anxiety Depression negative neurological ROS     GI/Hepatic Neg liver ROS, GERD  ,  Endo/Other  negative endocrine ROS  Renal/GU negative Renal ROS  negative genitourinary   Musculoskeletal negative musculoskeletal ROS (+)   Abdominal   Peds  Hematology negative hematology ROS (+)   Anesthesia Other Findings   Reproductive/Obstetrics                            Anesthesia Physical Anesthesia Plan  ASA: III  Anesthesia Plan: General and Regional   Post-op Pain Management:  Regional for Post-op pain   Induction: Intravenous  PONV Risk Score and Plan: 3 and Midazolam, Dexamethasone and Ondansetron  Airway Management Planned: LMA and Oral ETT  Additional Equipment:   Intra-op Plan:   Post-operative Plan: Extubation in OR  Informed Consent: I have reviewed the patients History and Physical, chart, labs and discussed the procedure including the risks, benefits and alternatives for the proposed anesthesia with the patient or authorized representative who has indicated his/her understanding and acceptance.     Dental advisory given  Plan Discussed with: CRNA  Anesthesia Plan Comments:        Anesthesia Quick Evaluation

## 2018-12-25 NOTE — H&P (Signed)
Savannah Lyons is an 43 y.o. female.   Chief Complaint: left elbow distal humerus fracture displaced with loss of function motion and notable pain at over 3 weeks from date of injury HPI: patient presents for surgical repair left elbow.  We have had a comprehensive discussion yesterdy in my office which was her first time visit regarding risk and benefits of surgical reconstructive efforts.  Patient presents for evaluation and treatment of the of their upper extremity predicament. The patient denies neck, back, chest or  abdominal pain. The patient notes that they have no lower extremity problems. The patients primary complaint is noted. We are planning surgical care pathway for the upper extremity.  Past Medical History:  Diagnosis Date  . Anxiety   . Depression   . Hypertension   . Seasonal allergies   . Sleep apnea    cpap  . Smoking    never smoked    Past Surgical History:  Procedure Laterality Date  . CHOLECYSTECTOMY N/A 09/16/2012   Procedure: LAPAROSCOPIC CHOLECYSTECTOMY ;  Surgeon: Edward Jolly, MD;  Location: [facility];  Service: General;  Laterality: N/A;  . KNEE ARTHROSCOPY WITH LATERAL RELEASE  2009  . RHINOPLASTY    . WISDOM TOOTH EXTRACTION  1992    Family History  Problem Relation Age of Onset  . Diabetes Mother   . Clotting disorder Father    Social History:  reports that she quit smoking about 6 years ago. Her smoking use included cigarettes. She has a 0.50 pack-year smoking history. She has never used smokeless tobacco. She reports current alcohol use of about 3.0 standard drinks of alcohol per week. She reports that she does not use drugs.  Allergies:  Allergies  Allergen Reactions  . Codeine Nausea Only    No medications prior to admission.    Results for orders placed or performed during the hospital encounter of 12/24/18 (from the past 48 hour(s))  SARS Coronavirus 2 (Performed in [facility] hospital lab)     Status: None   Collection Time:  12/24/18 10:14 AM   Specimen: Nasal Swab  Result Value Ref Range   SARS Coronavirus 2 NEGATIVE NEGATIVE    Comment: (NOTE) SARS-CoV-2 target nucleic acids are NOT DETECTED. The SARS-CoV-2 RNA is generally detectable in upper and lower respiratory specimens during the acute phase of infection. Negative results do not preclude SARS-CoV-2 infection, do not rule out co-infections with other pathogens, and should not be used as the sole basis for treatment or other patient management decisions. Negative results must be combined with clinical observations, patient history, and epidemiological information. The expected result is Negative. Fact Sheet for Patients: SugarRoll.be Fact Sheet for Healthcare Providers: https://www.woods-mathews.com/ This test is not yet approved or cleared by the [place] FDA and  has been authorized for detection and/or diagnosis of SARS-CoV-2 by FDA under an Emergency Use Authorization (EUA). This EUA will remain  in effect (meaning this test can be used) for the duration of the COVID-19 declaration under Section 56 4(b)(1) of the Act, 21 U.S.C. section 360bbb-3(b)(1), unless the authorization is terminated or revoked sooner. Performed at [facility], [place] [address], [place], [place] [address]    No results found.  ROS  There were no vitals taken for this visit. Physical Exam  Left elbow with swelling comminuted complex interarticular distal humerus fracture with displacement and angulation.  She has potional characteristics about the elbow.  Her radial median and ulnar nerve function is intact.  Has no  signs of instability about the wrist or shoulder  I reviewed CTSCAN AND RADIOGRAPHS AT GREAT LENGTH>>  We will plan for reconstructivefforts.  The patient is alert and oriented in no acute distress. The patient complains of pain in the affected upper extremity.  The patient is noted to have a normal  HEENT exam. Lung fields show equal chest expansion and no shortness of breath. Abdomen exam is nontender without distention. Lower extremity examination does not show any fracture dislocation or blood clot symptoms. Pelvis is stable and the neck and back are stable and nontender. Assessment/Plan We are planning surgery for your upper extremity. The risk and benefits of surgery to include risk of bleeding, infection, anesthesia,  damage to normal structures and failure of the surgery to accomplish its intended goals of relieving symptoms and restoring function have been discussed in detail. With this in mind we plan to proceed. I have specifically discussed with the patient the pre-and postoperative regime and the dos and don'ts and risk and benefits in great detail. Risk and benefits of surgery also include risk of dystrophy(CRPS), chronic nerve pain, failure of the healing process to go onto completion and other inherent risks of surgery The relavent the pathophysiology of the disease/injury process, as well as the alternatives for treatment and postoperative course of action has been discussed in great detail with the patient who desires to proceed.  We will do everything in our power to help you (the patient) restore function to the upper extremity. It is a pleasure to see this patient today.   We will plan for surgical reconstruction left upper extremity with olecranon osteotomy, ulnar nerve release and transposition and distal humerus ORIF  Patient understands the complexities and risk including stiffness ulnar neuropathy and poor function.  Our goal be firm fixation in early rehabilitative efforts.  Oletta CohnWilliam M Orley Lawry III, MD 12/25/2018, 6:08 AM

## 2018-12-25 NOTE — Transfer of Care (Signed)
Immediate Anesthesia Transfer of Care Note  Patient: Savannah Lyons  Procedure(s) Performed: Open reduction internal fixation left distal humerus fracture, supracondylar humerus fracture with olecranon osteotomy and ulnar nerve decompression and transposition (Left )  Patient Location: PACU  Anesthesia Type:GA combined with regional for post-op pain  Level of Consciousness: drowsy and patient cooperative  Airway & Oxygen Therapy: Patient Spontanous Breathing and Patient connected to nasal cannula oxygen  Post-op Assessment: Report given to RN, Post -op Vital signs reviewed and stable and Patient moving all extremities  Post vital signs: Reviewed and stable  Last Vitals:  Vitals Value Taken Time  BP 111/56 12/25/18 1621  Temp    Pulse 113 12/25/18 1621  Resp 12 12/25/18 1621  SpO2 97 % 12/25/18 1621  Vitals shown include unvalidated device data.  Last Pain:  Vitals:   12/25/18 1145  TempSrc:   PainSc: 0-No pain         Complications: No apparent anesthesia complications

## 2018-12-25 NOTE — Anesthesia Procedure Notes (Signed)
Anesthesia Regional Block: Supraclavicular block   Pre-Anesthetic Checklist: ,, timeout performed, Correct Patient, Correct Site, Correct Laterality, Correct Procedure, Correct Position, site marked, Risks and benefits discussed,  Surgical consent,  Pre-op evaluation,  At surgeon's request and post-op pain management  Laterality: Left  Prep: Maximum Sterile Barrier Precautions used, chloraprep       Needles:  Injection technique: Single-shot  Needle Type: Echogenic Stimulator Needle     Needle Length: 5cm  Needle Gauge: 22     Additional Needles:   Procedures:,,,, ultrasound used (permanent image in chart),,,,  Narrative:  Start time: 12/25/2018 11:28 AM End time: 12/25/2018 11:38 AM Injection made incrementally with aspirations every 5 mL.  Performed by: Personally  Anesthesiologist: Freddrick March, MD  Additional Notes: Monitors applied. No increased pain on injection. No increased resistance to injection. Injection made in 5cc increments. Good needle visualization. Patient tolerated procedure well.

## 2018-12-25 NOTE — Anesthesia Procedure Notes (Signed)
Procedure Name: Intubation Date/Time: 12/25/2018 12:51 PM Performed by: Marsa Aris, CRNA Pre-anesthesia Checklist: Patient identified, Emergency Drugs available, Suction available and Patient being monitored Patient Re-evaluated:Patient Re-evaluated prior to induction Oxygen Delivery Method: Circle System Utilized Preoxygenation: Pre-oxygenation with 100% oxygen Induction Type: IV induction Ventilation: Mask ventilation without difficulty Laryngoscope Size: Miller and 2 Grade View: Grade I Tube type: Oral Tube size: 7.0 mm Number of attempts: 1 Airway Equipment and Method: Stylet and Oral airway Placement Confirmation: ETT inserted through vocal cords under direct vision,  positive ETCO2 and breath sounds checked- equal and bilateral Secured at: 20 cm Tube secured with: Tape Dental Injury: Teeth and Oropharynx as per pre-operative assessment  Comments: No change in dentition from pre-procedure

## 2018-12-25 NOTE — Anesthesia Postprocedure Evaluation (Signed)
Anesthesia Post Note  Patient: Savannah Lyons  Procedure(s) Performed: Open reduction internal fixation left distal humerus fracture, supracondylar humerus fracture with olecranon osteotomy and ulnar nerve decompression and transposition (Left )     Patient location during evaluation: PACU Anesthesia Type: Regional and General Level of consciousness: awake and alert Pain management: pain level controlled Vital Signs Assessment: post-procedure vital signs reviewed and stable Respiratory status: spontaneous breathing, nonlabored ventilation and respiratory function stable Cardiovascular status: blood pressure returned to baseline and stable Postop Assessment: no apparent nausea or vomiting Anesthetic complications: no    Last Vitals:  Vitals:   12/25/18 1650 12/25/18 1705  BP: (!) 103/48 104/62  Pulse: (!) 108 (!) 103  Resp: 13 12  Temp:    SpO2: 92% 91%    Last Pain:  Vitals:   12/25/18 1650  TempSrc:   PainSc: 0-No pain                 Lidia Collum

## 2018-12-25 NOTE — Op Note (Signed)
Operative note 12/25/2018  Savannah Lyons  Preoperative diagnosis comminuted complex intra-articular left elbow fracture 3 weeks and 2 days out from injury.  Postop diagnosis the same.  Surgical procedure #1 open reduction internal fixation supracondylar humerus fracture left upper extremity with plate and screw construct.  #2 olecranon osteotomy left elbow #3 ulnar nerve decompression and anterior transposition left elbow #4 5 view radiographic series left elbow performed examined and interpreted by myself.  Surgeon Savannah SeverinWilliam Borden Thune.  Anesthesia General with preoperative block.  Estimated blood loss minimal.  Drains none.  Indications this patient is a 43 year old female who is been seen outside of my office for quite some time with a fracture.  She was ultimately referred to Dr. Carola FrostHandy and due to a host of issues was then referred to me by Dr. Carola FrostHandy for surgical fixation.  She understands risk and benefits surgery and desires to proceed with surgical endeavors.  All questions have been encouraged and answered preoperatively.  This is a very challenging fracture and I did discuss her risk of stiffness as well as risk of infection bleeding anesthesia damage to normal structures and failure of surgery to conscious intended goals of leaving symptoms and restoring function.  Operative procedure patient was brought to the operative theater underwent a general anesthetic was placed in a sloppy lateral position with axillary fold.  Body parts well-padded sequential compression devices were placed and the patient then underwent padding of the torso.  The peroneal nerve was very carefully padded and following this I prepped her with 2 Hibiclens pre-scrubs followed by 10-minute surgical Betadine scrub and paint sterile field was carried out landmarks were made Ioband and sterile tourniquet was placed in the sterile tourniquet was then insufflated after timeout was observed and preoperative antibiotics were  given.  Patient tolerated this well posterior incision was made utilitarian in nature skin flaps elevated significantly.  I then identified the ulnar nerve.  The ulnar nerve was released about the arcade of Struthers medial intermuscular septum which was excised 2 heads of the FCU Osborne's ligament and the cubital tunnel.  It was gently mobilized up on its epineurial plexus of vessels and then placed in a subcutaneous pocket between the subcutaneous fat and the flexor pronator fascia.  I kept this out of harm's way at all times.  This was a distinct and separate portion of the procedure and was a ulnar nerve decompression and anterior transposition.  Following this attention was turned towards the ulna.  Outlined marks were made and the patient then had a guidepin placed about the olecranon region into the shaft this was marked on AP and lateral radiograph and following this a soft near was placed followed by a osteotome for the distal far cortex.  The patient tolerated this well once this was complete I then flipped back the olecranon osteotomy.  The olecranon osteotomy was flipped back nicely and I then accessed the fracture.  The patient tolerated this well.  Once this was accessed I then set out to perform a ORIF.  This was an olecranon osteotomy performed without difficulty.  Once this was complete we then performed a ORIF of the distal humerus.  This was performed with a Biomet lateral plate as well as headless screws about the medial column into the trochlea and the lateral epicondylar region.  We are able to reduce in stool and restore the anterior humeral line on radiograph and interoperatively.  I had a homerun screw from the plate over to the trochlea spool  as well as 2 headless screws from the medial condyle region to the trochlea spool.  I do not see a point in placing a medial plate given the fact that the entire medial column looked excellent there is no plate that would wrap distal to where  this very small shearing fracture was present thus I was very meticulous with utilizing the headless compression screws for purchase purposes.  I then placed patient through full range of motion in all looked excellent.  I was pleased with this and then irrigated with 2 and half liters of saline.  Following this I meticulously made sure the ulnar nerve is stable and it was.  Following this final copy x-rays were taken 5 view series performed examined and interpreted by myself.  I was happy with all screw lengths and findings.  Once this was complete I then performed the repair of the olecranon osteotomy with a cable placed distal to the osteotomy site x2-3 diameters followed by placement of a 90 mm cannulated screw.  I made sure the cannulated screw was flush around the bone and the cable sat flush around the olecranon and washer.  This was tightened with the Biomet tightener.  The cable was a figure-of-eight construct which was tightened without difficulty.  The olecranon osteotomy was coapted under compression with the combination of the cable and the compression screw which was a 90 mm titanium screw with washer.  Following this I then repaired the fascia and then transfer the ulnar nerve into an anterior transposed state.  It was tension-free I tacked the fascial adipose tissue against the medial epicondyle and oversewed the medial epicondyle which did not have plate and screw fixation but cover the headless screws nicely.  The ulnar nerve set nicely and the patient had extension to 5 degrees full pronation supination without locking popping catching and full flexion.  I was pleased this I then sequentially closed the wound with 3-0 Vicryl followed by 3-0 Prolene.  Patient tolerated this well she was placed in a long-arm splint.  Should be admitted for IV antibiotics general postop observation.  I will see her back in 10 to 14 days immediate well arm assisted range of motion with therapy and a  long-arm splint.  I want to get her out of the [facility] quickly she has been sitting still for quite some time.  Should any problems occur she will notify me.  Is been absent pleasure to see her today anticipating her care plan.  Despite the extended timeframe duration from injury to presentation we are going to do everything we can to give her the most functional and excellent elbow possible.  Roseanne Kaufman MD

## 2018-12-26 ENCOUNTER — Encounter (HOSPITAL_COMMUNITY): Payer: Self-pay | Admitting: Orthopedic Surgery

## 2018-12-26 DIAGNOSIS — S42412A Displaced simple supracondylar fracture without intercondylar fracture of left humerus, initial encounter for closed fracture: Secondary | ICD-10-CM | POA: Diagnosis not present

## 2018-12-26 NOTE — Evaluation (Signed)
Occupational Therapy Evaluation and Discharge Patient Details Name: Savannah Lyons MRN: [id number] DOB: Aug 29, 1975 Today's Date: 12/26/2018    History of Present Illness s/p ORIF L distal humerus, olecranon osteostomy, ulnar nerve decompression   Clinical Impression   Pt educated in edema management techniques for LUE and elevated, iced and performed AROM of L digits and shoulder. Instructed in compensatory strategies for ADL. Pt verbalizing understanding. Pt has assistance of her mother and teenage daughter at home. No further OT needs.    Follow Up Recommendations  Follow surgeon's recommendation for DC plan and follow-up therapies    Equipment Recommendations  None recommended by OT    Recommendations for Other Services       Precautions / Restrictions Precautions Precautions: None Restrictions Other Position/Activity Restrictions: assume NWB L UE      Mobility Bed Mobility Overal bed mobility: Modified Independent             General bed mobility comments: HOB up  Transfers Overall transfer level: Independent                    Balance                                           ADL either performed or assessed with clinical judgement   ADL Overall ADL's : Modified independent                                       General ADL Comments: educated in compensatory strategies for ADL     Vision Baseline Vision/History: No visual deficits       Perception     Praxis      Pertinent Vitals/Pain Pain Assessment: 0-10 Pain Score: 7  Pain Location: L elbow Pain Descriptors / Indicators: Guarding;Grimacing;Aching Pain Intervention(s): Monitored during session;Premedicated before session;Repositioned;Ice applied     Hand Dominance Right   Extremity/Trunk Assessment Upper Extremity Assessment Upper Extremity Assessment: LUE deficits/detail LUE Deficits / Details: educated in elevation, icing and AROM L fingers  and shoulder LUE: Unable to fully assess due to immobilization LUE Coordination: decreased gross motor;decreased fine motor   Lower Extremity Assessment Lower Extremity Assessment: Overall WFL for tasks assessed   Cervical / Trunk Assessment Cervical / Trunk Assessment: Normal   Communication Communication Communication: No difficulties   Cognition Arousal/Alertness: Awake/alert Behavior During Therapy: WFL for tasks assessed/performed Overall Cognitive Status: Within Functional Limits for tasks assessed                                     General Comments       Exercises     Shoulder Instructions      Home Living Family/patient expects to be discharged to:: Private residence Living Arrangements: Children;Parent Available Help at Discharge: Family;Available 24 hours/day Type of Home: House                                  Prior Functioning/Environment Level of Independence: Independent                 OT Problem List:        OT Treatment/Interventions:  OT Goals(Current goals can be found in the care plan section) Acute Rehab OT Goals Patient Stated Goal: regain as much use of her L UE as possible  OT Frequency:     Barriers to D/C:            Co-evaluation              AM-PAC OT "6 Clicks" Daily Activity     Outcome Measure Help from another person eating meals?: A Little Help from another person taking care of personal grooming?: None Help from another person toileting, which includes using toliet, bedpan, or urinal?: None Help from another person bathing (including washing, rinsing, drying)?: A Little Help from another person to put on and taking off regular upper body clothing?: None Help from another person to put on and taking off regular lower body clothing?: None 6 Click Score: 22   End of Session Equipment Utilized During Treatment: Other (comment)(sling)  Activity Tolerance: Patient tolerated  treatment well Patient left: in bed;with call bell/phone within reach  OT Visit Diagnosis: Pain Pain - Right/Left: Left Pain - part of body: Arm                Time: [id number] OT Time Calculation (min): 29 min Charges:  OT General Charges $OT Visit: 1 Visit OT Evaluation $OT Eval Low Complexity: 1 Low OT Treatments $Therapeutic Activity: 8-22 mins  Martie RoundJulie Avrum Kimball, OTR/L Acute Rehabilitation Services Pager: [phone] Office: [phone] Evern BioMayberry, Yazlynn Birkeland Lynn 12/26/2018, 8:52 AM

## 2018-12-26 NOTE — Plan of Care (Signed)

## 2018-12-26 NOTE — Progress Notes (Signed)
PT Cancellation Note  Patient Details Name: Savannah Lyons MRN: [id number] DOB: 1976-05-28   Cancelled Treatment:    Reason Eval/Treat Not Completed: PT screened, no needs identified, will sign off Patient screened by OT - no PT needs identified. PT to sign off.    Lanney Gins, PT, DPT Supplemental Physical Therapist 12/26/18 8:57 AM Pager: [phone] Office: [phone]

## 2018-12-26 NOTE — Progress Notes (Signed)
Patient ID: Savannah Lyons, female   DOB: Oct 29, 1975, 43 y.o.   MRN: [id number] Patient seen and examined at bedside.  Patient is neurovascularly intact.  Radial median and ulnar nerves are intact  She has good refill.  Compartment soft flexion and extension of the fingers look excellent.  Pain as expected and we discussed this.  Lower extremity examination moves.  She has been able to urinate and tolerate p.o. medicines.  I reviewed this with her at length and the findings.  At present time we would recommend continued elevation range of motion and plan for discharge tomorrow.  Okay to continue pain management. The patient is alert and oriented in no acute distress. The patient complains of pain in the affected upper extremity.  The patient is noted to have a normal HEENT exam. Lung fields show equal chest expansion and no shortness of breath. Abdomen exam is nontender without distention. Lower extremity examination does not show any fracture dislocation or blood clot symptoms. Pelvis is stable and the neck and back are stable and nontender.   Jezelle Gullick MD

## 2018-12-27 DIAGNOSIS — S42412A Displaced simple supracondylar fracture without intercondylar fracture of left humerus, initial encounter for closed fracture: Secondary | ICD-10-CM | POA: Diagnosis not present

## 2018-12-27 MED ORDER — OXYCODONE HCL 5 MG PO TABS: 5.0000 mg | ORAL_TABLET | ORAL | 0 refills | Status: AC | PRN

## 2018-12-27 MED ORDER — METHOCARBAMOL 500 MG PO TABS: 500.0000 mg | ORAL_TABLET | Freq: Four times a day (QID) | ORAL | 0 refills | Status: AC | PRN

## 2018-12-27 MED ORDER — CEPHALEXIN 500 MG PO CAPS: 500.0000 mg | ORAL_CAPSULE | Freq: Four times a day (QID) | ORAL | 0 refills | Status: AC

## 2018-12-27 MED ORDER — ONDANSETRON HCL 4 MG PO TABS: 4.0000 mg | ORAL_TABLET | Freq: Every day | ORAL | 1 refills | Status: AC | PRN

## 2018-12-27 NOTE — Discharge Summary (Signed)
Physician Discharge Summary  Patient ID: Savannah Lyons Stave MRN: [id number] DOB/AGE: 1975-12-16 43 y.o.  Admit date: 12/25/2018 Discharge date:   Admission Diagnoses: Left comminuted complex intra-articular distal humerus fracture Past Medical History:  Diagnosis Date  . Anxiety   . Depression   . Hypertension   . Seasonal allergies   . Sleep apnea    cpap  . Smoking    never smoked    Discharge Diagnoses:  Active Problems:   Left elbow fracture, sequela   Surgeries: Procedure(s): Open reduction internal fixation left distal humerus fracture, supracondylar humerus fracture with olecranon osteotomy and ulnar nerve decompression and transposition on 12/25/2018    Consultants:   Discharged Condition: Improved  Hospital Course: Savannah Lyons Bodkins is an 43 y.o. female who was admitted 12/25/2018 with a chief complaint of No chief complaint on file. , and found to have a diagnosis of Left comminuted complex intra-articular distal humerus fracture.  They were brought to the operating room on 12/25/2018 and underwent Procedure(s): Open reduction internal fixation left distal humerus fracture, supracondylar humerus fracture with olecranon osteotomy and ulnar nerve decompression and transposition.    They were given perioperative antibiotics:  Anti-infectives (From admission, onward)   Start     Dose/Rate Route Frequency Ordered Stop   12/27/18 0000  cephALEXin (KEFLEX) 500 MG capsule     500 mg Oral 4 times daily 12/27/18 0652 01/03/19 2359   12/26/18 0030  ceFAZolin (ANCEF) IVPB 1 g/50 mL premix     1 g 100 mL/hr over 30 Minutes Intravenous Every 8 hours 12/25/18 1845     12/25/18 1900  ceFAZolin (ANCEF) IVPB 1 g/50 mL premix     1 g 100 mL/hr over 30 Minutes Intravenous NOW 12/25/18 1845 12/26/18 0707   12/25/18 0945  ceFAZolin (ANCEF) IVPB 2g/100 mL premix     2 g 200 mL/hr over 30 Minutes Intravenous On call to O.R. 12/25/18 [id number] 12/25/18 1300    .  They were given sequential  compression devices, early ambulation, and Other (comment) for DVT prophylaxis.  Recent vital signs:  Patient Vitals for the past 24 hrs:  BP Temp Temp src Pulse Resp SpO2  12/27/18 0436 (!) 104/52 98.7 F (37.1 C) - 64 17 96 %  12/26/18 1959 (!) 111/58 99.2 F (37.3 C) Oral 89 18 93 %  12/26/18 1500 116/60 98.9 F (37.2 C) Oral 87 18 97 %  .  Recent laboratory studies: No results found.  Discharge Medications:   Allergies as of 12/27/2018      Reactions   Codeine Nausea Only      Medication List    STOP taking these medications   HYDROcodone-acetaminophen 7.5-325 MG tablet Commonly known as: NORCO   oxyCODONE-acetaminophen 5-325 MG tablet Commonly known as: PERCOCET/ROXICET     TAKE these medications   celecoxib 200 MG capsule Commonly known as: CeleBREX Take 1 capsule (200 mg total) by mouth 2 (two) times daily.   cephALEXin 500 MG capsule Commonly known as: KEFLEX Take 1 capsule (500 mg total) by mouth 4 (four) times daily for 7 days.   citalopram 40 MG tablet Commonly known as: CELEXA Take 40 mg by mouth daily.   levocetirizine 5 MG tablet Commonly known as: XYZAL Take 5 mg by mouth every evening.   losartan-hydrochlorothiazide 100-25 MG tablet Commonly known as: HYZAAR Take 1 tablet by mouth daily.   methocarbamol 500 MG tablet Commonly known as: Robaxin Take 1 tablet (500 mg total) by mouth every 6 (six) hours  as needed for muscle spasms.   omeprazole 20 MG tablet Commonly known as: PRILOSEC OTC Take 20 mg by mouth at bedtime.   ondansetron 4 MG tablet Commonly known as: Zofran Take 1 tablet (4 mg total) by mouth daily as needed for nausea or vomiting.   oxyCODONE 5 MG immediate release tablet Commonly known as: Roxicodone Take 1 tablet (5 mg total) by mouth every 4 (four) hours as needed.       Diagnostic Studies: Dg Elbow Complete Left  Result Date: 12/02/2018 CLINICAL DATA:  Fall this morning landing on hand and left elbow. EXAM: LEFT  ELBOW - COMPLETE 3+ VIEW COMPARISON:  None. FINDINGS: Examination demonstrates a minimally displaced fracture of the distal humeral condylar region. There is displaced anterior fat pad. Remainder the exam is unremarkable. IMPRESSION: Minimally displaced distal humeral condylar fracture. Electronically Signed   By: Marin Olp M.D.   On: 12/02/2018 14:02   Dg Wrist Complete Left  Result Date: 12/02/2018 CLINICAL DATA:  Fall, pain EXAM: LEFT HAND - COMPLETE 3+ VIEW; LEFT WRIST - COMPLETE 3+ VIEW COMPARISON:  None. FINDINGS: No fracture or dislocation of the left hand or wrist. The carpus is normally aligned. Joint spaces are well preserved. No soft tissue abnormality appreciated. IMPRESSION: No fracture or dislocation of the left hand or wrist. The carpus is normally aligned. Joint spaces are well preserved. No soft tissue abnormality appreciated. Electronically Signed   By: Eddie Candle M.D.   On: 12/02/2018 16:12   Ct Elbow Left Wo Contrast  Result Date: 12/05/2018 CLINICAL DATA:  Evaluate left elbow fracture. EXAM: CT OF THE UPPER LEFT EXTREMITY WITHOUT CONTRAST TECHNIQUE: Multidetector CT imaging of the upper left extremity was performed according to the standard protocol. COMPARISON:  Left elbow radiographs 12/02/2010 FINDINGS: Examination is somewhat limited by patient motion. There is a nondisplaced cortical fracture involving the supracondylar region of the humerus on the ulnar aspect. Condylar fracture on the radial side through the capitellum without significant displacement. Skin very slight impaction and slight volar rotation of the capitellum in relation to the humerus. No fracture of the radius or ulna is identified. Moderate-sized elbow joint effusion. IMPRESSION: 1. Nondisplaced supracondylar cortical fracture on the ulnar aspect of the distal humerus. 2. Condylar fracture involving the capitellum with mild impaction and slight rotation. 3. No acute fracture of the radius or ulna.  Electronically Signed   By: Marijo Sanes M.D.   On: 12/05/2018 14:36   Dg Hand Complete Left  Result Date: 12/02/2018 CLINICAL DATA:  Fall, pain EXAM: LEFT HAND - COMPLETE 3+ VIEW; LEFT WRIST - COMPLETE 3+ VIEW COMPARISON:  None. FINDINGS: No fracture or dislocation of the left hand or wrist. The carpus is normally aligned. Joint spaces are well preserved. No soft tissue abnormality appreciated. IMPRESSION: No fracture or dislocation of the left hand or wrist. The carpus is normally aligned. Joint spaces are well preserved. No soft tissue abnormality appreciated. Electronically Signed   By: Eddie Candle M.D.   On: 12/02/2018 16:12    They benefited maximally from their hospital stay and there were no complications.     Disposition: Discharge disposition: 01-Home or Self Care      Discharge Instructions    Call MD / Call 911   Complete by: As directed    If you experience chest pain or shortness of breath, CALL 911 and be transported to the hospital emergency room.  If you develope a fever above 101 F, pus (white drainage) or increased  drainage or redness at the wound, or calf pain, call your surgeon's office.   Constipation Prevention   Complete by: As directed    Drink plenty of fluids.  Prune juice may be helpful.  You may use a stool softener, such as Colace (over the counter) 100 mg twice a day.  Use MiraLax (over the counter) for constipation as needed.   Diet - low sodium heart healthy   Complete by: As directed    Increase activity slowly as tolerated   Complete by: As directed      Follow-up Information    Dominica SeverinGramig, Adaleigh Warf, MD Follow up in 12 day(s).   Specialty: Orthopedic Surgery Why: We will call to see you in 10 to 14 days Contact information: [address] [place] [id number] [phone]          Status post left elbow reconstruction.  Discharge medicines on chart.  I will see her back in 12 days to begin therapy.  No complicating features  during her hospital stay.  No signs of urinary tract infection, DVT or other issue.  She is awake alert and oriented and understands all instructions  Final diagnosis status post open reduction internal fixation left supracondylar humerus fracture about the elbow with olecranon osteotomy and ulnar nerve transposition           Signed: Oletta CohnWilliam M Akhila Mahnken III 12/27/2018, 6:52 AM

## 2018-12-27 NOTE — Discharge Instructions (Signed)
We will call for your follow-up appointment in 10 to 14 days.  Please remember to take your stool softener.  Please call for any problems.  Please continue to ice and move your fingers frequently.  Please be very careful and purposeful with your activities.  Keep bandage clean and dry.  Call for any problems.  No smoking.  Criteria for driving a car: you should be off your pain medicine for 7-8 hours, able to drive one handed(confident), thinking clearly and feeling able in your judgement to drive. Continue elevation as it will decrease swelling.  If instructed by MD move your fingers within the confines of the bandage/splint.  Use ice if instructed by your MD. Call immediately for any sudden loss of feeling in your hand/arm or change in functional abilities of the extremity.  We recommend that you to take vitamin C 1000 mg a day to promote healing. We also recommend that if you require  pain medicine that you take a stool softener to prevent constipation as most pain medicines will have constipation side effects. We recommend either Peri-Colace or Senokot and recommend that you also consider adding MiraLAX as well to prevent the constipation affects from pain medicine if you are required to use them. These medicines are over the counter and may be purchased at a local pharmacy. A cup of yogurt and a probiotic can also be helpful during the recovery process as the medicines can disrupt your intestinal environment.

## 2020-05-03 IMAGING — CT CT OF THE LEFT ELBOW WITHOUT CONTRAST
1 of 5 series · 3 of 14 positions shown, 4 images · non-contrast
Comparison: Left elbow radiographs 12/02/2010

CLINICAL DATA: Evaluate left elbow fracture.

EXAM:
CT OF THE UPPER LEFT EXTREMITY WITHOUT CONTRAST
TECHNIQUE: Multidetector CT imaging of the upper left extremity was performed
according to the standard protocol.

[Series 5: ext-soft · axial · 0.34mm/px · z∈[-233,-83]mm · 3 of 76 slices shown, 4 images]
[im 1/76  soft-tissue]
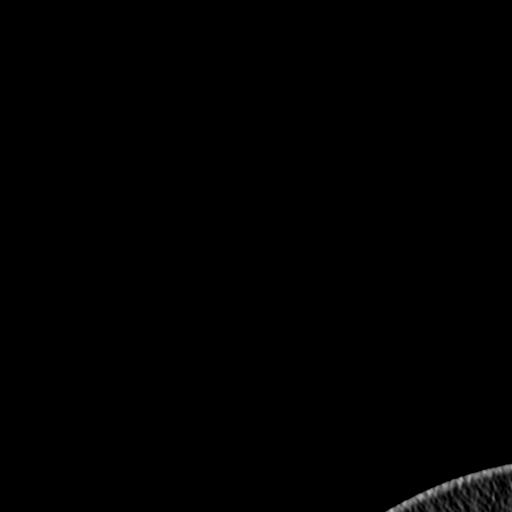
[im 1/76  bone]
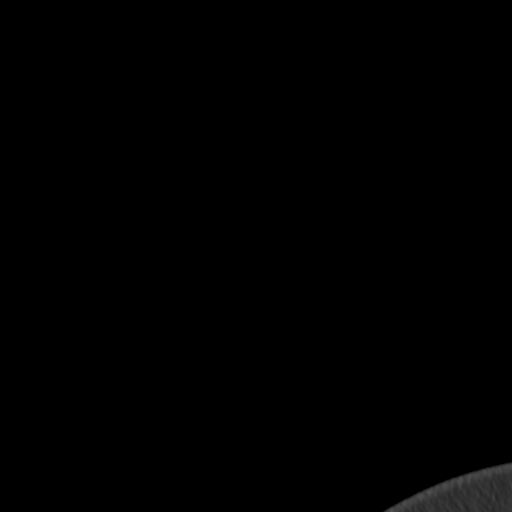
[im 38/76  bone]
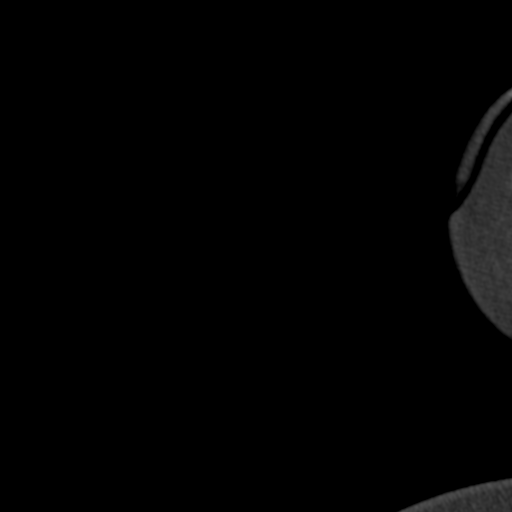
[im 76/76  bone]
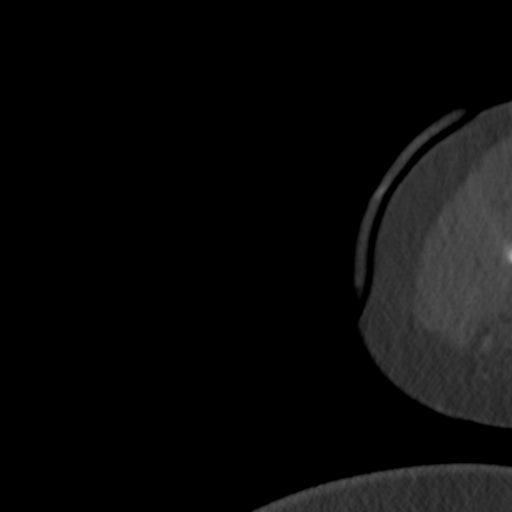

[3 of 14 positions shown; findings below may reference images not displayed]

FINDINGS: Examination is somewhat limited by patient motion.

There is a nondisplaced cortical fracture involving the
supracondylar region of the humerus on the ulnar aspect.

Condylar fracture on the radial side through the capitellum without
significant displacement. Skin very slight impaction and slight
volar rotation of the capitellum in relation to the humerus.

No fracture of the radius or ulna is identified.

Moderate-sized elbow joint effusion.
IMPRESSION: 1. Nondisplaced supracondylar cortical fracture on the ulnar aspect
of the distal humerus.
2. Condylar fracture involving the capitellum with mild impaction
and slight rotation.
3. No acute fracture of the radius or ulna.

## 2024-06-02 ENCOUNTER — Ambulatory Visit
Admission: RE | Admit: 2024-06-02 | Discharge: 2024-06-02 | Disposition: A | Discharge status: Home / Self Care | Attending: Family Medicine

## 2024-06-02 ENCOUNTER — Other Ambulatory Visit: Payer: Self-pay

## 2024-06-02 VITALS — BP 120/82 | HR 98 | Temp 98.2°F | Resp 20

## 2024-06-02 DIAGNOSIS — J069 Acute upper respiratory infection, unspecified: Secondary | ICD-10-CM | POA: Diagnosis not present

## 2024-06-02 LAB — POC COVID19/FLU A&B COMBO
Covid Antigen, POC: NEGATIVE
Influenza A Antigen, POC: NEGATIVE
Influenza B Antigen, POC: NEGATIVE

## 2024-06-02 LAB — POCT RAPID STREP A (OFFICE): Rapid Strep A Screen: NEGATIVE

## 2024-06-02 MED ORDER — PROMETHAZINE-DM 6.25-15 MG/5ML PO SYRP: 5.0000 mL | ORAL_SOLUTION | Freq: Four times a day (QID) | ORAL | 0 refills | Status: AC | PRN

## 2024-06-02 NOTE — Discharge Instructions (Signed)
 Increase fluid intake.

## 2024-06-02 NOTE — ED Triage Notes (Signed)
 Pt states she has had a cough x 1 week and sinus congestion. Pt states throat is swollen and hurts.Pt also reports large amounts of mucous being formed in the back of her throat. Pt c/o RT ear pain. Pt denies any fevers. Pt has taken DayQuil and NyQuil with temporary relief.

## 2024-06-02 NOTE — ED Provider Notes (Signed)
 [facility]    CSN: [id number] Arrival date & time: 06/02/24  9071      History   Chief Complaint Chief Complaint  Patient presents with   Sore Throat    Sore throat, lots of congestion, primarily produced in throat - Entered by patient   Otalgia   Cough   Nasal Congestion    HPI Savannah Lyons is a 48 y.o. female.    Sore Throat  Otalgia Associated symptoms: cough   Cough Associated symptoms: ear pain   Sick for 1 week symptoms include postnasal drip, sore throat, nasal congestion.  Admits cough productive of sputum .  Admits right ear fullness.  Denies fever, chills, sweats, body aches, fatigue, drainage from ears, chest pain, shortness of breath, wheezing, nausea, vomiting, diarrhea.  Taking over-the-counter DayQuil and NyQuil without relief.  Allergy to codeine.  Daughter was home for Thanksgiving had some respiratory symptoms, no confirmed COVID or flu exposures. Past Medical History:  Diagnosis Date   Anxiety    Depression    Hypertension    Seasonal allergies    Sleep apnea    cpap   Smoking    never smoked    Patient Active Problem List   Diagnosis Date Noted   Left elbow fracture, sequela 12/25/2018   Obstructive sleep apnea 05/08/2015   Seasonal and perennial allergic rhinitis 05/08/2015   Nasal septal deviation 05/08/2015    Past Surgical History:  Procedure Laterality Date   CHOLECYSTECTOMY N/A 09/16/2012   Procedure: LAPAROSCOPIC CHOLECYSTECTOMY ;  Surgeon: Morene ONEIDA Olives, MD;  Location: [facility];  Service: General;  Laterality: N/A;   KNEE ARTHROSCOPY WITH LATERAL RELEASE  2009   ORIF HUMERUS FRACTURE Left 12/25/2018   Procedure: Open reduction internal fixation left distal humerus fracture, supracondylar humerus fracture with olecranon osteotomy and ulnar nerve decompression and transposition;  Surgeon: Camella Fallow, MD;  Location: [facility];  Service: Orthopedics;  Laterality: Left;  2.5-3 hrs Pre op Block   RHINOPLASTY     WISDOM  TOOTH EXTRACTION  1992    OB History     Gravida  2   Para  0   Term  0   Preterm      AB      Living  2      SAB      IAB      Ectopic      Multiple      Live Births  2            Home Medications    Prior to Admission medications   Medication Sig Start Date End Date Taking? Authorizing Provider  promethazine -dextromethorphan (PROMETHAZINE -DM) 6.25-15 MG/5ML syrup Take 5 mLs by mouth 4 (four) times daily as needed for up to 6 days for cough. 06/02/24 06/08/24 Yes Kely Dohn, PA  celecoxib  (CELEBREX ) 200 MG capsule Take 1 capsule (200 mg total) by mouth 2 (two) times daily. 12/05/18   Hilts, Ozell, MD  citalopram  (CELEXA ) 40 MG tablet Take 40 mg by mouth daily.    [provider]  levocetirizine (XYZAL) 5 MG tablet Take 5 mg by mouth every evening.    [provider]  losartan -hydrochlorothiazide  (HYZAAR) 100-25 MG tablet Take 1 tablet by mouth daily. 12/13/18   [provider]  methocarbamol  (ROBAXIN ) 500 MG tablet Take 1 tablet (500 mg total) by mouth every 6 (six) hours as needed for muscle spasms. 12/27/18   Camella Fallow, MD  omeprazole  (PRILOSEC  OTC) 20 MG tablet Take  20 mg by mouth at bedtime.    [provider]    Family History Family History  Problem Relation Age of Onset   Diabetes Mother    Clotting disorder Father     Social History Social History   Tobacco Use   Smoking status: Former    Current packs/day: 0.00    Average packs/day: 0.5 packs/day for 1 year (0.5 ttl pk-yrs)    Types: Cigarettes    Start date: 06/28/2011    Quit date: 06/27/2012    Years since quitting: 11.9   Smokeless tobacco: Never   Tobacco comments:    5-6 cigs daily  Vaping Use   Vaping status: Never Used  Substance Use Topics   Alcohol use: Yes    Alcohol/week: 3.0 standard drinks of alcohol    Types: 3 Glasses of wine per week    Comment: Weekly.   Drug use: No     Allergies   Codeine   Review of  Systems Review of Systems  HENT:  Positive for ear pain.   Respiratory:  Positive for cough.      Physical Exam Triage Vital Signs ED Triage Vitals  Encounter Vitals Group     BP 06/02/24 0943 120/82     Girls Systolic BP Percentile --      Girls Diastolic BP Percentile --      Boys Systolic BP Percentile --      Boys Diastolic BP Percentile --      Pulse Rate 06/02/24 0943 98     Resp 06/02/24 0943 20     Temp 06/02/24 0943 98.2 F (36.8 C)     Temp Source 06/02/24 0943 Oral     SpO2 06/02/24 0943 96 %     Weight --      Height --      Head Circumference --      Peak Flow --      Pain Score 06/02/24 0940 3     Pain Loc --      Pain Education --      Exclude from Growth Chart --    No data found.  Updated Vital Signs BP 120/82 (BP Location: Right Arm)   Pulse 98   Temp 98.2 F (36.8 C) (Oral)   Resp 20   SpO2 96%   Visual Acuity Right Eye Distance:   Left Eye Distance:   Bilateral Distance:    Right Eye Near:   Left Eye Near:    Bilateral Near:     Physical Exam Vitals and nursing note reviewed.  Constitutional:      Appearance: She is not ill-appearing.  HENT:     Head: Normocephalic and atraumatic.     Right Ear: Tympanic membrane and ear canal normal. Tympanic membrane is not erythematous.     Left Ear: Tympanic membrane and ear canal normal. Tympanic membrane is not erythematous.     Nose: Congestion present. No rhinorrhea.     Mouth/Throat:     Mouth: Mucous membranes are moist. No oral lesions.     Pharynx: No oropharyngeal exudate, posterior oropharyngeal erythema or uvula swelling.     Tonsils: No tonsillar exudate or tonsillar abscesses.     Comments: Slightly muffled voice no tonsillar exudate normal swallowing, no evidence of peritonsillar abscess Eyes:     Conjunctiva/sclera: Conjunctivae normal.  Cardiovascular:     Rate and Rhythm: Normal rate and regular rhythm.     Heart sounds: No murmur heard.  No friction rub.  Pulmonary:      Effort: Pulmonary effort is normal.     Breath sounds: Normal breath sounds.      UC Treatments / Results  Labs (all labs ordered are listed, but only abnormal results are displayed) Labs Reviewed  POCT RAPID STREP A (OFFICE) - Normal  POC COVID19/FLU A&B COMBO - Normal    EKG   Radiology No results found.  Procedures Procedures (including critical care time)  Medications Ordered in UC Medications - No data to display  Initial Impression / Assessment and Plan / UC Course  I have reviewed the triage vital signs and the nursing notes.  Pertinent labs & imaging results that were available during my care of the patient were reviewed by me and considered in my medical decision making (see chart for details).     48 year old former smoker past medical history of hypertension depression allergies and sleep apnea presents with cough and congestion for 1 week associated with sore throat ear pain she is nontoxic-appearing, vital signs are stable, has slightly muffled voice but no evidence of peritonsillar abscess.  Lungs are clear to auscultation, point-of-care COVID strep and flu are all negative.  No evidence of bacterial infection Home care warning signs and follow-up reviewed patient, increase fluid intake Rx Promethazine  DM to pharmacy Final Clinical Impressions(s) / UC Diagnoses   Final diagnoses:  Acute upper respiratory infection     Discharge Instructions      Increase fluid intake     ED Prescriptions     Medication Sig Dispense Auth. Provider   promethazine -dextromethorphan (PROMETHAZINE -DM) 6.25-15 MG/5ML syrup Take 5 mLs by mouth 4 (four) times daily as needed for up to 6 days for cough. 118 mL Juanjesus Pepperman, [place]      PDMP not reviewed this encounter.   Makylie Rivere, [place] 06/02/24 1012
